# Patient Record
Sex: Male | Born: 1970 | Race: Black or African American | Hispanic: No | Marital: Married | Smoking: Never smoker
Health system: Southern US, Community
[De-identification: ages and names within clinical notes are randomized; demographics above are authoritative.]

---

## 1997-07-16 ENCOUNTER — Emergency Department (HOSPITAL_COMMUNITY): Admission: EM | Admit: 1997-07-16 | Discharge: 1997-07-16 | Payer: Self-pay | Admitting: *Deleted

## 2000-07-24 ENCOUNTER — Encounter: Payer: Self-pay | Admitting: Emergency Medicine

## 2000-07-24 ENCOUNTER — Emergency Department (HOSPITAL_COMMUNITY): Admission: EM | Admit: 2000-07-24 | Discharge: 2000-07-24 | Payer: Self-pay | Admitting: Emergency Medicine

## 2011-12-10 ENCOUNTER — Emergency Department (HOSPITAL_COMMUNITY)
Admission: EM | Admit: 2011-12-10 | Discharge: 2011-12-10 | Disposition: A | Discharge status: Home / Self Care | Payer: BC Managed Care – PPO | Attending: Emergency Medicine | Admitting: Emergency Medicine

## 2011-12-10 ENCOUNTER — Encounter (HOSPITAL_COMMUNITY): Payer: Self-pay | Admitting: Emergency Medicine

## 2011-12-10 DIAGNOSIS — R112 Nausea with vomiting, unspecified: Secondary | ICD-10-CM | POA: Insufficient documentation

## 2011-12-10 DIAGNOSIS — R51 Headache: Secondary | ICD-10-CM | POA: Insufficient documentation

## 2011-12-10 MED ORDER — DIPHENHYDRAMINE HCL 50 MG/ML IJ SOLN
25.0000 mg | Freq: Once | INTRAMUSCULAR | Status: AC
  Administered 2011-12-10: 25 mg via INTRAVENOUS
  Filled 2011-12-10: qty 1

## 2011-12-10 MED ORDER — ONDANSETRON HCL 4 MG/2ML IJ SOLN
4.0000 mg | Freq: Once | INTRAMUSCULAR | Status: AC
  Administered 2011-12-10: 4 mg via INTRAVENOUS
  Filled 2011-12-10: qty 2

## 2011-12-10 MED ORDER — HYDROMORPHONE HCL PF 1 MG/ML IJ SOLN
1.0000 mg | Freq: Once | INTRAMUSCULAR | Status: AC
  Administered 2011-12-10: 1 mg via INTRAVENOUS
  Filled 2011-12-10: qty 1

## 2011-12-10 MED ORDER — SODIUM CHLORIDE 0.9 % IV BOLUS (SEPSIS)
1000.0000 mL | Freq: Once | INTRAVENOUS | Status: AC
  Administered 2011-12-10: 1000 mL via INTRAVENOUS

## 2011-12-10 NOTE — ED Notes (Signed)
Ambulatory in department with steady gait.  No complaints of pain, no nausea.

## 2011-12-10 NOTE — ED Provider Notes (Signed)
History     CSN: [id number]  Arrival date & time 12/10/11  0047   First MD Initiated Contact with Patient 12/10/11 0126      Chief Complaint  Patient presents with  . Headache    (Consider location/radiation/quality/duration/timing/severity/associated sxs/prior treatment) HPI  Nathaniel Miller is a 41 y.o. male who presents to the Emergency Department complaining of right sided headache that began at 22100. He has taken 4 aspirin without relief. He denies vision changes, hearing changes, difficulty speaking or swallowing, stiff neck, fever, chills. He has had associated nausea and vomiting.   History reviewed. No pertinent past medical history.  History reviewed. No pertinent past surgical history.  No family history on file.  History  Substance Use Topics  . Smoking status: Never Smoker   . Smokeless tobacco: Not on file  . Alcohol Use: No      Review of Systems  Constitutional: Negative for fever.       10 Systems reviewed and are negative for acute change except as noted in the HPI.  HENT: Negative for congestion.   Eyes: Negative for discharge and redness.  Respiratory: Negative for cough and shortness of breath.   Cardiovascular: Negative for chest pain.  Gastrointestinal: Positive for nausea and vomiting. Negative for abdominal pain.  Musculoskeletal: Negative for back pain.  Skin: Negative for rash.  Neurological: Positive for headaches. Negative for syncope and numbness.  Psychiatric/Behavioral:       No behavior change.    Allergies  Review of patient's allergies indicates no known allergies.  Home Medications  No current outpatient prescriptions on file.  BP 136/92  Pulse 60  Temp 97.9 F (36.6 C) (Oral)  Resp 18  Ht 5\' 9"  (1.753 m)  Wt 205 lb (92.987 kg)  BMI 30.27 kg/m2  SpO2 98%  Physical Exam  Nursing note and vitals reviewed. Constitutional: He is oriented to person, place, and time. He appears well-developed and well-nourished.    Awake, alert, nontoxic appearance.  HENT:  Head: Normocephalic and atraumatic.  Right Ear: External ear normal.  Left Ear: External ear normal.  Nose: Nose normal.  Mouth/Throat: Oropharynx is clear and moist.  Eyes: Conjunctivae normal and EOM are normal. Pupils are equal, round, and reactive to light. Right eye exhibits no discharge. Left eye exhibits no discharge.  Neck: Neck supple.  Cardiovascular: Normal heart sounds.   Pulmonary/Chest: Effort normal and breath sounds normal. He exhibits no tenderness.  Abdominal: Soft. There is no tenderness. There is no rebound.  Musculoskeletal: He exhibits no tenderness.       Baseline ROM, no obvious new focal weakness.  Neurological: He is alert and oriented to person, place, and time. He has normal reflexes. No cranial nerve deficit.       Mental status and motor strength appears baseline for patient and situation.  Skin: No rash noted.  Psychiatric: He has a normal mood and affect.    ED Course  Procedures (including critical care time)   0400 Headache has resolved. Nausea has resolved. Patient feels ready for discharge.  MDM  Patient with headache to right side of head that began at 2100. Given IVF, analgesic, benadryl, antiemetic with relief. Pt feels improved after observation and/or treatment in ED.Pt stable in ED with no significant deterioration in condition.The patient appears reasonably screened and/or stabilized for discharge and I doubt any other medical condition or other [facility] requiring further screening, evaluation, or treatment in the ED at this time prior to discharge.  MDM  Reviewed: nursing note           Nicoletta Dress. Colon Branch, MD 12/10/11 0272

## 2011-12-10 NOTE — ED Notes (Signed)
States he had the sudden onset of severe headache with nausea and vomiting x1.  States he took a total of 4 aspirin at home without relief.  States he has had a headache similar to this one in the past.

## 2012-03-25 ENCOUNTER — Ambulatory Visit (INDEPENDENT_AMBULATORY_CARE_PROVIDER_SITE_OTHER): Payer: BC Managed Care – PPO | Admitting: Internal Medicine

## 2012-03-25 VITALS — BP 132/86 | HR 62 | Temp 97.6°F | Resp 16 | Ht 67.38 in | Wt 206.2 lb

## 2012-03-25 DIAGNOSIS — L299 Pruritus, unspecified: Secondary | ICD-10-CM

## 2012-03-25 DIAGNOSIS — B356 Tinea cruris: Secondary | ICD-10-CM

## 2012-03-25 MED ORDER — TERBINAFINE HCL 250 MG PO TABS: 250.0000 mg | ORAL_TABLET | Freq: Two times a day (BID) | ORAL | Status: DC

## 2012-03-25 MED ORDER — ECONAZOLE NITRATE 1 % EX CREA: TOPICAL_CREAM | Freq: Two times a day (BID) | CUTANEOUS | Status: DC

## 2012-03-25 NOTE — Progress Notes (Signed)
  Subjective:    Patient ID: Nathaniel Miller, male    DOB: 1970/05/02, 41 y.o.   MRN: [id number]  HPI 3 months of jock itch txed by his FP with mycolog 2 cream and otc hc cream. Told him to never use hydrocortisone cream on his genitals or his face from now on. Has an itch rash in groin covered with medication therefore cannot KOH now.    Review of Systems     Objective:   Physical Exam Rash bilateral groin, some skin break down. No signs of bacterial infection. No other issues       Assessment & Plan:  Tinea cruis/do not know if any damage from cortisone to his groin skin yet Econazole 1% cream/lamisil 250mg  bid 10 days F/up 1 week

## 2012-03-25 NOTE — Patient Instructions (Signed)
Jock Itch Jock itch is a fungal infection of the skin in the groin area. It is sometimes called "ringworm" even though it is not caused by a worm. A fungus is a type of germ that thrives in dark, damp places.  CAUSES  This infection may spread from:  A fungus infection elsewhere on the body (such as athlete's foot).  Sharing towels or clothing. This infection is more common in:  Hot, humid climates.  People who wear tight-fitting clothing or wet bathing suits for long periods of time.  Athletes.  Overweight people.  People with diabetes. SYMPTOMS  Jock itch causes the following symptoms:  Red, pink or brown rash in the groin. Rash may spread to the thighs, anus, and buttocks.  Itching. DIAGNOSIS  Your caregiver may make the diagnosis by looking at the rash. Sometimes a skin scraping will be sent to test for fungus. Testing can be done either by looking under the microscope or by doing a culture (test to try to grow the fungus). A culture can take up to 2 weeks to come back. TREATMENT  Jock itch may be treated with:  Skin cream or ointment to kill fungus.  Medicine by mouth to kill fungus.  Skin cream or ointment to calm the itching.  Compresses or medicated powders to dry the infected skin. HOME CARE INSTRUCTIONS   Be sure to treat the rash completely. Follow your caregiver's instructions. It can take a couple of weeks to treat. If you do not treat the infection long enough, the rash can come back.  Wear loose-fitting clothing.  Men should wear cotton boxer shorts.  Women should wear cotton underwear.  Avoid hot baths.  Dry the groin area well after bathing. SEEK MEDICAL CARE IF:   Your rash is worse.  Your rash is spreading.  Your rash returns after treatment is finished.  Your rash is not gone in 4 weeks. Fungal infections are slow to respond to treatment. Some redness may remain for several weeks after the fungus is gone. SEEK IMMEDIATE MEDICAL CARE  IF:  The area becomes red, warm, tender, and swollen.  You have a fever. Document Released: 01/24/2002 Document Revised: 04/28/2011 Document Reviewed: 12/24/2007 ExitCare Patient Information 2013 [employer].  

## 2012-04-13 ENCOUNTER — Ambulatory Visit (INDEPENDENT_AMBULATORY_CARE_PROVIDER_SITE_OTHER): Payer: BC Managed Care – PPO | Admitting: Family Medicine

## 2012-04-13 VITALS — BP 124/82 | HR 75 | Temp 98.9°F | Resp 17 | Ht 67.0 in | Wt 207.0 lb

## 2012-04-13 DIAGNOSIS — B356 Tinea cruris: Secondary | ICD-10-CM

## 2012-04-13 MED ORDER — HYDROXYZINE HCL 25 MG PO TABS: 25.0000 mg | ORAL_TABLET | Freq: Three times a day (TID) | ORAL | Status: DC | PRN

## 2012-04-13 NOTE — Patient Instructions (Addendum)
Thank you for coming in today. Your rash is healing well.  Itching is normal.  Take hydroxyzine at night as needed.  Use the cream until is runs out.  For severe itching use gold bond itch.   Come back as needed.

## 2012-04-13 NOTE — Progress Notes (Signed)
Nathaniel Miller is a 42 y.o. male who presents to [facility] today for followup tinea cruris.  Was seen in March 25, 2012 for long-standing tinea capitis. He was treated with topical antifungals and oral Lamisil.  He notes this has worked very well. His rash is significantly improved. However he notes recent onset of itching. Less than a shower he scratched significantly.  He peeled off some old skin and wants to know if everything is okay.  No fevers chills weakness numbness. Well otherwise.   PMH: Reviewed healthy History  Substance Use Topics  . Smoking status: Never Smoker   . Smokeless tobacco: Not on file  . Alcohol Use: No   ROS as above  Medications reviewed. Current Outpatient Prescriptions  Medication Sig Dispense Refill  . econazole nitrate 1 % cream Apply topically 2 (two) times daily.  30 g  3  . terbinafine (LAMISIL) 250 MG tablet Take 1 tablet (250 mg total) by mouth 2 (two) times daily.  20 tablet  0  . hydrOXYzine (ATARAX/VISTARIL) 25 MG tablet Take 1 tablet (25 mg total) by mouth 3 (three) times daily as needed for itching.  30 tablet  0   No current facility-administered medications for this visit.    Exam:  BP 124/82  Pulse 75  Temp(Src) 98.9 F (37.2 C) (Oral)  Resp 17  Ht 5\' 7"  (1.702 m)  Wt 207 lb (93.895 kg)  BMI 32.41 kg/m2  SpO2 97% Gen: Well NAD Skin: Well-appearing new skin and intertriginous areas bilateral groin. No scale or significant erythema.  No results found for this or any previous visit (from the past 72 hour(s)).  Assessment and Plan: 42 y.o. male with resolving tinea curis.  Plan: Continue topical antifungals Oral hydroxyzine as needed at night for itching Gold Bond Itch as needed. F/u prn

## 2012-08-27 ENCOUNTER — Other Ambulatory Visit: Payer: Self-pay

## 2012-08-27 NOTE — Telephone Encounter (Signed)
Last seen 04/21/12  DWM

## 2012-09-17 ENCOUNTER — Other Ambulatory Visit: Payer: Self-pay

## 2012-12-02 ENCOUNTER — Encounter: Payer: Self-pay | Admitting: Family Medicine

## 2012-12-02 ENCOUNTER — Ambulatory Visit (INDEPENDENT_AMBULATORY_CARE_PROVIDER_SITE_OTHER): Payer: BC Managed Care – PPO | Admitting: Family Medicine

## 2012-12-02 ENCOUNTER — Encounter (INDEPENDENT_AMBULATORY_CARE_PROVIDER_SITE_OTHER): Payer: Self-pay

## 2012-12-02 VITALS — BP 148/89 | HR 59 | Temp 97.0°F | Wt 201.8 lb

## 2012-12-02 DIAGNOSIS — Z8669 Personal history of other diseases of the nervous system and sense organs: Secondary | ICD-10-CM

## 2012-12-02 DIAGNOSIS — L299 Pruritus, unspecified: Secondary | ICD-10-CM

## 2012-12-02 DIAGNOSIS — B356 Tinea cruris: Secondary | ICD-10-CM

## 2012-12-02 MED ORDER — TERBINAFINE HCL 250 MG PO TABS: 250.0000 mg | ORAL_TABLET | Freq: Two times a day (BID) | ORAL | Status: DC

## 2012-12-02 MED ORDER — TOPIRAMATE 50 MG PO TABS: 50.0000 mg | ORAL_TABLET | Freq: Two times a day (BID) | ORAL | Status: DC

## 2012-12-02 MED ORDER — KETOCONAZOLE 2 % EX CREA: TOPICAL_CREAM | Freq: Every day | CUTANEOUS | Status: DC

## 2012-12-02 NOTE — Progress Notes (Signed)
  Subjective:    Patient ID: Nathaniel Miller, male    DOB: [birth year], 42 y.o.   MRN: [id number]  HPI This 42 y.o. male presents for evaluation of c/o jock itch.  He has had this in the past and now It is back.  He is having frequent migraines happening every 3 weeks.  He used to get them Annually they have increased.  He does get aura.   Review of Systems C/o headache and aura.  C/o rash. No chest pain, SOB, dizziness, vision change, N/V, diarrhea, constipation, dysuria, urinary urgency or frequency.     Objective:   Physical Exam Vital signs noted  Well developed well nourished male.  HEENT - Head atraumatic Normocephalic                Eyes - PERRLA, Conjuctiva - clear Sclera- Clear EOMI                Ears - EAC's Wnl TM's Wnl Gross Hearing WNL                Nose - Nares patent                 Throat - oropharanx wnl Respiratory - Lungs CTA bilateral Cardiac - RRR S1 and S2 without murmur GI - Abdomen soft Nontender and bowel sounds active x 4. Skin - Rash in groin bilateral.       Assessment & Plan:  Tinea cruris - Plan: ketoconazole (NIZORAL) 2 % cream, terbinafine (LAMISIL) 250 MG tablet  Jock Itch - Plan: terbinafine (LAMISIL) 250 MG tablet  History of migraine headaches - Plan: topiramate (TOPAMAX) 50 MG tablet  Deatra Canter FNP

## 2012-12-02 NOTE — Patient Instructions (Signed)
Headache and Allergies The relationship between allergies and headaches is unclear. Many people with allergic or infectious nasal problems also have headaches (migraines or sinus headaches). However, sometimes allergies can cause pressure that feels like a headache, and sometimes headaches can cause allergy-like symptoms. It is not always clear whether your symptoms are caused by allergies or by a headache. CAUSES   Migraine: The cause of a migraine is not always known.  Sinus Headache: The cause of a sinus headache may be a sinus infection. Other conditions that may be related to sinus headaches include:  Hay fever (allergic rhinitis).  Deviation of the nasal septum.  Swelling or clogging of the nasal passages. SYMPTOMS  Migraine headache symptoms (which often last 4 to 72 hours) include:  Intense, throbbing pain on one or both sides of the head.  Nausea.  Vomiting.  Being extra sensitive to light.  Being extra sensitive to sound.  Nervous system reactions that appear similar to an allergic reaction:  Stuffy nose.  Runny nose.  Tearing. Sinus headaches are felt as facial pain or pressure.  DIAGNOSIS  Because there is some overlap in symptoms, sinus and migraine headaches are often misdiagnosed. For example, a person with migraines may also feel facial pressure. Likewise, many people with hay fever may get migraine headaches rather than sinus headaches. These migraines can be triggered by the histamine release during an allergic reaction. An antihistamine medicine can eliminate this pain. There are standard criteria that help clarify the difference between these headaches and related allergy or allergy-like symptoms. Your caregiver can use these criteria to determine the proper diagnosis and provide you the best care. TREATMENT  Migraine medicine may help people who have persistent migraine headaches even though their hay fever is controlled. For some people, anti-inflammatory  treatments do not work to relieve migraines. Medicines called triptans (such as sumatriptan) can be helpful for those people. Document Released: 04/26/2003 Document Revised: 04/28/2011 Document Reviewed: 05/18/2009 ExitCare Patient Information 2014 [employer].  

## 2012-12-03 ENCOUNTER — Telehealth: Payer: Self-pay | Admitting: Family Medicine

## 2012-12-06 ENCOUNTER — Other Ambulatory Visit: Payer: Self-pay | Admitting: Family Medicine

## 2012-12-06 MED ORDER — NYSTATIN-TRIAMCINOLONE 100000-0.1 UNIT/GM-% EX OINT: TOPICAL_OINTMENT | Freq: Two times a day (BID) | CUTANEOUS | Status: DC

## 2012-12-23 ENCOUNTER — Other Ambulatory Visit: Payer: Self-pay

## 2013-01-11 ENCOUNTER — Other Ambulatory Visit: Payer: Self-pay | Admitting: Family Medicine

## 2013-01-11 DIAGNOSIS — L299 Pruritus, unspecified: Secondary | ICD-10-CM

## 2013-01-11 DIAGNOSIS — B356 Tinea cruris: Secondary | ICD-10-CM

## 2013-01-11 MED ORDER — TERBINAFINE HCL 250 MG PO TABS: ORAL_TABLET | ORAL | Status: DC

## 2013-02-03 ENCOUNTER — Other Ambulatory Visit: Payer: Self-pay | Admitting: Nurse Practitioner

## 2013-02-03 DIAGNOSIS — B356 Tinea cruris: Secondary | ICD-10-CM

## 2013-02-03 DIAGNOSIS — L299 Pruritus, unspecified: Secondary | ICD-10-CM

## 2013-02-03 MED ORDER — TERBINAFINE HCL 250 MG PO TABS: ORAL_TABLET | ORAL | Status: DC

## 2013-08-09 ENCOUNTER — Encounter: Payer: Self-pay | Admitting: Physician Assistant

## 2013-08-09 ENCOUNTER — Ambulatory Visit: Payer: BC Managed Care – PPO | Admitting: Physician Assistant

## 2013-08-09 ENCOUNTER — Ambulatory Visit (INDEPENDENT_AMBULATORY_CARE_PROVIDER_SITE_OTHER): Payer: BC Managed Care – PPO | Admitting: Physician Assistant

## 2013-08-09 VITALS — BP 142/90 | HR 66 | Temp 98.8°F | Ht 67.0 in | Wt 202.0 lb

## 2013-08-09 DIAGNOSIS — I10 Essential (primary) hypertension: Secondary | ICD-10-CM

## 2013-08-09 DIAGNOSIS — T50995A Adverse effect of other drugs, medicaments and biological substances, initial encounter: Secondary | ICD-10-CM

## 2013-08-09 DIAGNOSIS — T50905A Adverse effect of unspecified drugs, medicaments and biological substances, initial encounter: Secondary | ICD-10-CM

## 2013-08-09 NOTE — Patient Instructions (Signed)
Hypertension Hypertension, commonly called high blood pressure, is when the force of blood pumping through your arteries is too strong. Your arteries are the blood vessels that carry blood from your heart throughout your body. A blood pressure reading consists of a higher number over a lower number, such as 110/72. The higher number (systolic) is the pressure inside your arteries when your heart pumps. The lower number (diastolic) is the pressure inside your arteries when your heart relaxes. Ideally you want your blood pressure below 120/80. Hypertension forces your heart to work harder to pump blood. Your arteries may become narrow or stiff. Having hypertension puts you at risk for heart disease, stroke, and other problems.  RISK FACTORS Some risk factors for high blood pressure are controllable. Others are not.  Risk factors you cannot control include:   Race. You may be at higher risk if you are African American.  Age. Risk increases with age.  Gender. Men are at higher risk than women before age 45 years. After age 65, women are at higher risk than men. Risk factors you can control include:  Not getting enough exercise or physical activity.  Being overweight.  Getting too much fat, sugar, calories, or salt in your diet.  Drinking too much alcohol. SIGNS AND SYMPTOMS Hypertension does not usually cause signs or symptoms. Extremely high blood pressure (hypertensive crisis) may cause headache, anxiety, shortness of breath, and nosebleed. DIAGNOSIS  To check if you have hypertension, your health care provider will measure your blood pressure while you are seated, with your arm held at the level of your heart. It should be measured at least twice using the same arm. Certain conditions can cause a difference in blood pressure between your right and left arms. A blood pressure reading that is higher than normal on one occasion does not mean that you need treatment. If one blood pressure reading  is high, ask your health care provider about having it checked again. TREATMENT  Treating high blood pressure includes making lifestyle changes and possibly taking medication. Living a healthy lifestyle can help lower high blood pressure. You may need to change some of your habits. Lifestyle changes may include:  Following the DASH diet. This diet is high in fruits, vegetables, and whole grains. It is low in salt, red meat, and added sugars.  Getting at least 2 1/2 hours of brisk physical activity every week.  Losing weight if necessary.  Not smoking.  Limiting alcoholic beverages.  Learning ways to reduce stress. If lifestyle changes are not enough to get your blood pressure under control, your health care provider may prescribe medicine. You may need to take more than one. Work closely with your health care provider to understand the risks and benefits. HOME CARE INSTRUCTIONS  Have your blood pressure rechecked as directed by your health care provider.   Only take medicine as directed by your health care provider. Follow the directions carefully. Blood pressure medicines must be taken as prescribed. The medicine does not work as well when you skip doses. Skipping doses also puts you at risk for problems.   Do not smoke.   Monitor your blood pressure at home as directed by your health care provider. SEEK MEDICAL CARE IF:   You think you are having a reaction to medicines taken.  You have recurrent headaches or feel dizzy.  You have swelling in your ankles.  You have trouble with your vision. SEEK IMMEDIATE MEDICAL CARE IF:  You develop a severe headache or   confusion.  You have unusual weakness, numbness, or feel faint.  You have severe chest or abdominal pain.  You vomit repeatedly.  You have trouble breathing. MAKE SURE YOU:   Understand these instructions.  Will watch your condition.  Will get help right away if you are not doing well or get  worse. Document Released: 02/03/2005 Document Revised: 02/08/2013 Document Reviewed: 11/26/2012 ExitCare Patient Information 2015 [employer]. This information is not intended to replace advice given to you by your health care provider. Make sure you discuss any questions you have with your health care provider.  

## 2013-08-09 NOTE — Progress Notes (Signed)
Subjective:     Patient ID: Nathaniel Miller, male   DOB: [birth year], 43 y.o.   MRN: [id number]  HPI Pt here due to concerns of a supplement he ha been taking He took a supplement called VO2Max Pt was taking more than the suggested dose and states he just did not feel right Since stopping things have returned to normal He is very active lifting weights No sx at time of appt  Review of Systems  Constitutional: Negative.   Respiratory: Negative for apnea, cough, choking, chest tightness and shortness of breath.   Cardiovascular: Negative for chest pain, palpitations and leg swelling.       Objective:   Physical Exam  Nursing note and vitals reviewed. Constitutional: He appears well-developed and well-nourished.  Neck: No JVD present. No tracheal deviation present. No thyromegaly present.  Cardiovascular: Normal rate, regular rhythm, normal heart sounds and intact distal pulses.   Pulmonary/Chest: Breath sounds normal. No respiratory distress. He has no wheezes.  Musculoskeletal: He exhibits no edema.  Lymphadenopathy:    He has no cervical adenopathy.       Assessment:     Medicine reaction    Plan:     Reviewed the contents of his supplements with him and the possible SE Reviewed his borderline BP with him and the danger of stimulant type supplements Would like him to watch his BP through the plant nurse If readings remain at current level return for possible meds May try to supplement in small doses to see if he can tolerate F/U pending his BP readings

## 2013-08-29 ENCOUNTER — Telehealth: Payer: Self-pay | Admitting: *Deleted

## 2013-08-29 NOTE — Telephone Encounter (Signed)
Received fax from pharmacy requesting viagra 100mg . Take one tablet by mouth as needed. Originally given on 06-05-11 by Greig CastillaAndrew. Last seen in office on 6-23 by Montey HoraBill Webster. Please advise on refills

## 2013-08-30 ENCOUNTER — Other Ambulatory Visit: Payer: Self-pay | Admitting: Family Medicine

## 2013-08-30 MED ORDER — SILDENAFIL CITRATE 100 MG PO TABS: 50.0000 mg | ORAL_TABLET | Freq: Every day | ORAL | Status: DC | PRN

## 2013-08-30 NOTE — Telephone Encounter (Signed)
rx of viagra sent to pharm

## 2014-04-25 ENCOUNTER — Other Ambulatory Visit: Payer: Self-pay | Admitting: Family Medicine

## 2014-04-25 DIAGNOSIS — B356 Tinea cruris: Secondary | ICD-10-CM

## 2014-04-25 DIAGNOSIS — L299 Pruritus, unspecified: Secondary | ICD-10-CM

## 2014-04-25 MED ORDER — TERBINAFINE HCL 250 MG PO TABS: ORAL_TABLET | ORAL | Status: DC

## 2014-04-25 NOTE — Telephone Encounter (Signed)
Patient aware rx sent to pharmacy.  

## 2014-04-25 NOTE — Telephone Encounter (Signed)
rx sent to pharmacy

## 2015-02-21 ENCOUNTER — Ambulatory Visit (INDEPENDENT_AMBULATORY_CARE_PROVIDER_SITE_OTHER): Payer: BLUE CROSS/BLUE SHIELD | Admitting: Family Medicine

## 2015-02-21 ENCOUNTER — Encounter: Payer: Self-pay | Admitting: Family Medicine

## 2015-02-21 VITALS — BP 128/76 | HR 67 | Temp 97.9°F | Ht 67.0 in | Wt 205.0 lb

## 2015-02-21 DIAGNOSIS — B356 Tinea cruris: Secondary | ICD-10-CM | POA: Diagnosis not present

## 2015-02-21 MED ORDER — CLOTRIMAZOLE-BETAMETHASONE 1-0.05 % EX CREA: 1.0000 "application " | TOPICAL_CREAM | Freq: Two times a day (BID) | CUTANEOUS | Status: DC

## 2015-02-21 NOTE — Assessment & Plan Note (Signed)
Recurrent and has been using hydrocortisone, we'll send him something with antifungal and steroid in it.

## 2015-02-21 NOTE — Progress Notes (Signed)
BP 128/76 mmHg  Pulse 67  Temp(Src) 97.9 F (36.6 C) (Oral)  Ht 5\' 7"  (1.702 m)  Wt 205 lb (92.987 kg)  BMI 32.10 kg/m2   Subjective:    Patient ID: Nathaniel Miller, male    DOB: Mar 25, 1970, 45 y.o.   MRN: [id number]  HPI: Nathaniel Miller is a 45 y.o. male presenting on 02/21/2015 for Itchy rash in groin area   HPI Groin rash Patient has recurrent jock itch. He has been using hydrocortisone cream for it at this time appears to be worsening. Has a lot of pruritus in the region and some pink rash that is worse in the intertrigo areas and on his gonadal sac. He denies any fevers or chills or erythema or warmth or drainage.  Relevant past medical, surgical, family and social history reviewed and updated as indicated. Interim medical history since our last visit reviewed. Allergies and medications reviewed and updated.  Review of Systems  Constitutional: Negative for fever and chills.  HENT: Negative for ear discharge and ear pain.   Eyes: Negative for discharge and visual disturbance.  Respiratory: Negative for shortness of breath and wheezing.   Cardiovascular: Negative for chest pain and leg swelling.  Gastrointestinal: Negative for abdominal pain, diarrhea and constipation.  Genitourinary: Negative for difficulty urinating.  Musculoskeletal: Negative for back pain and gait problem.  Skin: Positive for rash.  Neurological: Negative for syncope, light-headedness and headaches.  All other systems reviewed and are negative.   Per HPI unless specifically indicated above     Medication List       This list is accurate as of: 02/21/15 12:56 PM.  Always use your most recent med list.               clotrimazole-betamethasone cream  Commonly known as:  LOTRISONE  Apply 1 application topically 2 (two) times daily.     hydrocortisone cream 1 %  Apply 1 application topically 4 (four) times daily.           Objective:    BP 128/76 mmHg  Pulse 67  Temp(Src) 97.9 F  (36.6 C) (Oral)  Ht 5\' 7"  (1.702 m)  Wt 205 lb (92.987 kg)  BMI 32.10 kg/m2  Wt Readings from Last 3 Encounters:  02/21/15 205 lb (92.987 kg)  08/09/13 202 lb (91.627 kg)  12/02/12 201 lb 12.8 oz (91.536 kg)    Physical Exam  Constitutional: He is oriented to person, place, and time. He appears well-developed and well-nourished. No distress.  Eyes: Conjunctivae and EOM are normal. Pupils are equal, round, and reactive to light. Right eye exhibits no discharge. No scleral icterus.  Neck: Neck supple. No thyromegaly present.  Cardiovascular: Normal rate, regular rhythm, normal heart sounds and intact distal pulses.   No murmur heard. Pulmonary/Chest: Effort normal and breath sounds normal. No respiratory distress. He has no wheezes.  Musculoskeletal: Normal range of motion. He exhibits no edema.  Neurological: He is alert and oriented to person, place, and time. Coordination normal.  Skin: Skin is warm and dry. Rash (In the groin in intertrigonal area and some on gonadal sac. Pink papules with satellite lesions) noted. Rash is papular. He is not diaphoretic.  Psychiatric: He has a normal mood and affect. His behavior is normal.  Nursing note and vitals reviewed.   No results found for this or any previous visit.    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      Relevant Medications   clotrimazole-betamethasone (LOTRISONE) cream       Follow up plan: Return if symptoms worsen or fail to improve, for Adult well exam and screening labs.  Counseling provided for all of the vaccine components No orders of the defined types were placed in this encounter.    Arville Care, MD [facility] 02/21/2015, 12:56 PM

## 2015-02-22 ENCOUNTER — Ambulatory Visit: Payer: Self-pay | Admitting: Family Medicine

## 2015-04-22 ENCOUNTER — Other Ambulatory Visit: Payer: Self-pay | Admitting: Family Medicine

## 2015-04-23 ENCOUNTER — Other Ambulatory Visit: Payer: Self-pay | Admitting: Family

## 2015-04-23 DIAGNOSIS — B356 Tinea cruris: Secondary | ICD-10-CM

## 2015-04-23 MED ORDER — CLOTRIMAZOLE-BETAMETHASONE 1-0.05 % EX CREA: 1.0000 "application " | TOPICAL_CREAM | Freq: Two times a day (BID) | CUTANEOUS | Status: DC

## 2015-09-12 ENCOUNTER — Other Ambulatory Visit: Payer: Self-pay | Admitting: Family

## 2015-09-12 DIAGNOSIS — B356 Tinea cruris: Secondary | ICD-10-CM

## 2015-09-13 ENCOUNTER — Other Ambulatory Visit: Payer: Self-pay | Admitting: Family Medicine

## 2015-09-13 DIAGNOSIS — B356 Tinea cruris: Secondary | ICD-10-CM

## 2015-09-13 MED ORDER — CLOTRIMAZOLE-BETAMETHASONE 1-0.05 % EX CREA: 1.0000 "application " | TOPICAL_CREAM | Freq: Two times a day (BID) | CUTANEOUS | 0 refills | Status: DC

## 2015-09-14 MED ORDER — CLOTRIMAZOLE-BETAMETHASONE 1-0.05 % EX CREA: 1.0000 "application " | TOPICAL_CREAM | Freq: Two times a day (BID) | CUTANEOUS | 0 refills | Status: DC

## 2015-12-20 ENCOUNTER — Other Ambulatory Visit: Payer: Self-pay | Admitting: Pediatrics

## 2015-12-20 ENCOUNTER — Other Ambulatory Visit: Payer: Self-pay

## 2015-12-20 DIAGNOSIS — B356 Tinea cruris: Secondary | ICD-10-CM

## 2015-12-20 MED ORDER — CLOTRIMAZOLE-BETAMETHASONE 1-0.05 % EX CREA: 1.0000 "application " | TOPICAL_CREAM | Freq: Two times a day (BID) | CUTANEOUS | 1 refills | Status: DC

## 2016-05-31 ENCOUNTER — Telehealth: Payer: BLUE CROSS/BLUE SHIELD | Admitting: Nurse Practitioner

## 2016-05-31 DIAGNOSIS — L739 Follicular disorder, unspecified: Secondary | ICD-10-CM

## 2016-05-31 MED ORDER — CEPHALEXIN 500 MG PO CAPS: 500.0000 mg | ORAL_CAPSULE | Freq: Four times a day (QID) | ORAL | 0 refills | Status: DC

## 2016-05-31 NOTE — Progress Notes (Signed)
E Visit for Rash  We are sorry that you are not feeling well. Here is how we plan to help!      Keflex 500 mg three times per day for 7 days       HOME CARE:   Take cool showers and avoid direct sunlight.  Apply cool compress or wet dressings.  Take a bath in an oatmeal bath.  Sprinkle content of one Aveeno packet under running faucet with comfortably warm water.  Bathe for 15-20 minutes, 1-2 times daily.  Pat dry with a towel. Do not rub the rash.  Use hydrocortisone cream.  Take an antihistamine like Benadryl for widespread rashes that itch.  The adult dose of Benadryl is 25-50 mg by mouth 4 times daily.  Caution:  This type of medication may cause sleepiness.  Do not drink alcohol, drive, or operate dangerous machinery while taking antihistamines.  Do not take these medications if you have prostate enlargement.  Read package instructions thoroughly on all medications that you take.  GET HELP RIGHT AWAY IF:   Symptoms don't go away after treatment.  Severe itching that persists.  If you rash spreads or swells.  If you rash begins to smell.  If it blisters and opens or develops a yellow-brown crust.  You develop a fever.  You have a sore throat.  You become short of breath.  MAKE SURE YOU:  Understand these instructions. Will watch your condition. Will get help right away if you are not doing well or get worse.  Thank you for choosing an e-visit. Your e-visit answers were reviewed by a board certified advanced clinical practitioner to complete your personal care plan. Depending upon the condition, your plan could have included both over the counter or prescription medications. Please review your pharmacy choice. Be sure that the pharmacy you have chosen is open so that you can pick up your prescription now.  If there is a problem you may message your provider in MyChart to have the prescription routed to another pharmacy. Your safety is important to us. If  you have drug allergies check your prescription carefully.  For the next 24 hours, you can use MyChart to ask questions about today's visit, request a non-urgent call back, or ask for a work or school excuse from your e-visit provider. You will get an email in the next two days asking about your experience. I hope that your e-visit has been valuable and will speed your recovery.      

## 2016-06-03 ENCOUNTER — Other Ambulatory Visit: Payer: Self-pay | Admitting: Nurse Practitioner

## 2016-06-03 DIAGNOSIS — B356 Tinea cruris: Secondary | ICD-10-CM

## 2016-06-03 MED ORDER — CLOTRIMAZOLE-BETAMETHASONE 1-0.05 % EX CREA: 1.0000 "application " | TOPICAL_CREAM | Freq: Two times a day (BID) | CUTANEOUS | 1 refills | Status: DC

## 2016-12-25 ENCOUNTER — Encounter: Payer: Self-pay | Admitting: Family Medicine

## 2016-12-25 ENCOUNTER — Other Ambulatory Visit: Payer: Self-pay | Admitting: Nurse Practitioner

## 2016-12-25 DIAGNOSIS — B356 Tinea cruris: Secondary | ICD-10-CM

## 2016-12-25 MED ORDER — CLOTRIMAZOLE-BETAMETHASONE 1-0.05 % EX CREA: 1.0000 "application " | TOPICAL_CREAM | Freq: Two times a day (BID) | CUTANEOUS | 1 refills | Status: DC

## 2016-12-25 NOTE — Telephone Encounter (Signed)
I do not know what he needs it for but he will likely have to be seen again

## 2017-02-02 ENCOUNTER — Encounter: Payer: Self-pay | Admitting: Family Medicine

## 2017-02-02 ENCOUNTER — Ambulatory Visit (INDEPENDENT_AMBULATORY_CARE_PROVIDER_SITE_OTHER): Payer: BLUE CROSS/BLUE SHIELD | Admitting: Family Medicine

## 2017-02-02 VITALS — BP 131/89 | HR 65 | Temp 96.8°F | Ht 67.0 in | Wt 207.0 lb

## 2017-02-02 DIAGNOSIS — G43009 Migraine without aura, not intractable, without status migrainosus: Secondary | ICD-10-CM | POA: Insufficient documentation

## 2017-02-02 MED ORDER — TOPIRAMATE 50 MG PO TABS: 50.0000 mg | ORAL_TABLET | Freq: Two times a day (BID) | ORAL | 1 refills | Status: DC

## 2017-02-02 NOTE — Progress Notes (Signed)
BP 131/89   Pulse 65   Temp (!) 96.8 F (36 C) (Oral)   Ht 5\' 7"  (1.702 m)   Wt 207 lb (93.9 kg)   BMI 32.42 kg/m    Subjective:    Patient ID: Nathaniel Miller, male    DOB: [birth year], 46 y.o.   MRN: [id number]  HPI: Nathaniel HugeLamont C Meuth is a 46 y.o. male presenting on 02/02/2017 for Migraine headaches (had history of migraines about 3 years ago but they had improved until about a month ago; taking Ibuprofen)   HPI Headaches and migraines Patient comes in complaining of migraine headaches that have been bothering him over the past few months.  He says he had issues like this over 3 years ago and he went on a medication daily for a few months and then they did not come back until recently.  He can pinpoint the only thing that changed over the recent 3 months is that he is getting some stress from his wife to buy a new car and financially that is putting a little bit of stress on them.  He describes the headaches as left-sided starting in the frontal region and going back and pounding.  He denies any fevers or chills or sinus pressure or drainage.  He denies any shortness of breath or wheezing.  He does have light sensitivity and nausea and vomiting with it.  What he usually takes between 7 and 8 ibuprofen to finally get rid of it.  The headaches last anywhere between 4-8 hours and have been occurring about once a week.  Relevant past medical, surgical, family and social history reviewed and updated as indicated. Interim medical history since our last visit reviewed. Allergies and medications reviewed and updated.  Review of Systems  Constitutional: Negative for chills and fever.  Respiratory: Negative for shortness of breath and wheezing.   Cardiovascular: Negative for chest pain and leg swelling.  Gastrointestinal: Positive for nausea and vomiting.  Musculoskeletal: Negative for back pain and gait problem.  Skin: Negative for rash.  Neurological: Positive for headaches. Negative for  dizziness, speech difficulty, weakness and numbness.  All other systems reviewed and are negative.   Per HPI unless specifically indicated above     Objective:    BP 131/89   Pulse 65   Temp (!) 96.8 F (36 C) (Oral)   Ht 5\' 7"  (1.702 m)   Wt 207 lb (93.9 kg)   BMI 32.42 kg/m   Wt Readings from Last 3 Encounters:  02/02/17 207 lb (93.9 kg)  02/21/15 205 lb (93 kg)  08/09/13 202 lb (91.6 kg)    Physical Exam  Constitutional: He is oriented to person, place, and time. He appears well-developed and well-nourished. No distress.  HENT:  Right Ear: Tympanic membrane, external ear and ear canal normal.  Left Ear: Tympanic membrane, external ear and ear canal normal.  Nose: No mucosal edema, rhinorrhea or sinus tenderness. No epistaxis. Right sinus exhibits no maxillary sinus tenderness and no frontal sinus tenderness. Left sinus exhibits no maxillary sinus tenderness and no frontal sinus tenderness.  Mouth/Throat: Uvula is midline and mucous membranes are normal. No oropharyngeal exudate, posterior oropharyngeal edema, posterior oropharyngeal erythema or tonsillar abscesses.  Eyes: Conjunctivae are normal. No scleral icterus.  Neck: Neck supple. No thyromegaly present.  Cardiovascular: Normal rate, regular rhythm, normal heart sounds and intact distal pulses.  No murmur heard. Pulmonary/Chest: Effort normal and breath sounds normal. No respiratory distress. He has no wheezes. He has  no rales.  Musculoskeletal: Normal range of motion. He exhibits no edema.  Lymphadenopathy:    He has no cervical adenopathy.  Neurological: He is alert and oriented to person, place, and time. No cranial nerve deficit. He exhibits normal muscle tone. Coordination normal.  Skin: Skin is warm and dry. No rash noted. He is not diaphoretic.  Psychiatric: He has a normal mood and affect. His behavior is normal.  Nursing note and vitals reviewed.   No results found for this or any previous visit.      Assessment & Plan:   Problem List Items Addressed This Visit      Cardiovascular and Mediastinum   Migraine without aura and without status migrainosus, not intractable - Primary   Relevant Medications   topiramate (TOPAMAX) 50 MG tablet       Follow up plan: Return if symptoms worsen or fail to improve.  Counseling provided for all of the vaccine components No orders of the defined types were placed in this encounter.   Arville CareJoshua Dettinger, MD [facility] 02/02/2017, 8:41 AM

## 2017-06-14 ENCOUNTER — Encounter: Payer: Self-pay | Admitting: Family Medicine

## 2017-06-15 ENCOUNTER — Other Ambulatory Visit: Payer: Self-pay | Admitting: Family

## 2017-06-15 DIAGNOSIS — B356 Tinea cruris: Secondary | ICD-10-CM

## 2017-06-15 MED ORDER — CLOTRIMAZOLE-BETAMETHASONE 1-0.05 % EX CREA: 1.0000 "application " | TOPICAL_CREAM | Freq: Two times a day (BID) | CUTANEOUS | 1 refills | Status: DC | PRN

## 2017-12-30 ENCOUNTER — Encounter: Payer: Self-pay | Admitting: Family Medicine

## 2017-12-30 DIAGNOSIS — B356 Tinea cruris: Secondary | ICD-10-CM

## 2017-12-31 MED ORDER — CLOTRIMAZOLE-BETAMETHASONE 1-0.05 % EX CREA: 1.0000 "application " | TOPICAL_CREAM | Freq: Two times a day (BID) | CUTANEOUS | 1 refills | Status: DC | PRN

## 2018-09-09 ENCOUNTER — Encounter: Payer: Self-pay | Admitting: Family Medicine

## 2018-09-09 DIAGNOSIS — B356 Tinea cruris: Secondary | ICD-10-CM

## 2018-09-09 MED ORDER — CLOTRIMAZOLE-BETAMETHASONE 1-0.05 % EX CREA: 1.0000 "application " | TOPICAL_CREAM | Freq: Two times a day (BID) | CUTANEOUS | 1 refills | Status: DC | PRN

## 2018-12-27 ENCOUNTER — Other Ambulatory Visit: Payer: Self-pay

## 2018-12-30 ENCOUNTER — Encounter (HOSPITAL_COMMUNITY): Payer: Self-pay | Admitting: Emergency Medicine

## 2018-12-30 ENCOUNTER — Ambulatory Visit (HOSPITAL_COMMUNITY)
Admission: EM | Admit: 2018-12-30 | Discharge: 2018-12-30 | Disposition: A | Discharge status: Home / Self Care | Payer: BC Managed Care – PPO | Attending: Family Medicine | Admitting: Family Medicine

## 2018-12-30 ENCOUNTER — Other Ambulatory Visit: Payer: Self-pay

## 2018-12-30 DIAGNOSIS — M79604 Pain in right leg: Secondary | ICD-10-CM

## 2018-12-30 MED ORDER — DEXAMETHASONE SODIUM PHOSPHATE 10 MG/ML IJ SOLN
10.0000 mg | Freq: Once | INTRAMUSCULAR | Status: AC
  Administered 2018-12-30: 10 mg via INTRAMUSCULAR

## 2018-12-30 MED ORDER — DEXAMETHASONE SODIUM PHOSPHATE 10 MG/ML IJ SOLN
INTRAMUSCULAR | Status: AC
  Filled 2018-12-30: qty 1

## 2018-12-30 MED ORDER — DICLOFENAC SODIUM 75 MG PO TBEC: 75.0000 mg | DELAYED_RELEASE_TABLET | Freq: Two times a day (BID) | ORAL | 0 refills | Status: DC
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
## 2018-12-30 NOTE — ED Provider Notes (Signed)
[place]   [id number] 12/30/18 Arrival Time: 6270  ASSESSMENT & PLAN:  1. Right leg pain   With sciatic-like symptoms.   Able to ambulate here and hemodynamically stable. No indication for imaging of back at this time given no trauma and normal neurological exam. Discussed.  Meds ordered this encounter  Medications  . dexamethasone (DECADRON) injection 10 mg  . diclofenac (VOLTAREN) 75 MG EC tablet    Sig: Take 1 tablet (75 mg total) by mouth 2 (two) times daily.    Dispense:  14 tablet    Refill:  0   Encourage ROM/movement as tolerated.  Recommend: Follow-up Information    [place].   Why: If worsening or failing to improve as anticipated. Contact information: [address] [place] [phone]          Reviewed expectations re: course of current medical issues. Questions answered. Outlined signs and symptoms indicating need for more acute intervention. Patient verbalized understanding. After Visit Summary given.   SUBJECTIVE: History from: patient.  Nathaniel Miller is a 48 y.o. male who presents with complaint of intermittent pain of his R "hip area"; mild lower back discomfort but mostly hip/leg. Onset gradual. First noted 2-3 d ago. Injury/trama: no. History of back problems requiring medical care: none reported. Pain described as aching with occasional sharp pain that travels from hip to knee. Pain is better with walking/standing, somewhat worse with movements involving back, and unchanged with rest. Definitely worse after prolonged sitting. Progressive LE weakness or saddle anesthesia: none. Extremity sensation changes or weakness: none. Ambulatory with difficulty. Normal bowel/bladder habits: yes; without urinary retention. Normal PO intake without n/v. No associated abdominal pain/n/v. Self treatment: has tried prn OTC analgesics without much relief.  Reports no chronic steroid  use, fevers, IV drug use, or recent back surgeries or procedures.  ROS: As per HPI. All other systems negative.   OBJECTIVE:  Vitals:   12/30/18 1511 12/30/18 1513  BP: (!) 149/112 134/88  Pulse: 60   Resp: 16   Temp: 99 F (37.2 C)   TempSrc: Oral   SpO2: 100%     General appearance: alert; no distress HEENT: [place]; AT Neck: supple with FROM; without midline tenderness CV: RRR Lungs: unlabored respirations; symmetrical air entry Abdomen: soft, non-tender; non-distended Back: no specific tenderness to palpation; FROM at waist; bruising: none; without midline tenderness Extremities: without edema; symmetrical without gross deformities; normal ROM of bilateral UE; does report vague discomfort over R buttock and lateral thigh Skin: warm and dry Neurologic: normal gait; normal reflexes of bilateral LE; normal sensation of bilateral LE; normal strength of bilateral LE Psychological: alert and cooperative; normal mood and affect    No Known Allergies  PMH: Migraines.  Social History   Socioeconomic History  . Marital status: Married    Spouse name: Not on file  . Number of children: Not on file  . Years of education: Not on file  . Highest education level: Not on file  Occupational History  . Not on file  Social Needs  . Financial resource strain: Not on file  . Food insecurity    Worry: Not on file    Inability: Not on file  . Transportation needs    Medical: Not on file    Non-medical: Not on file  Tobacco Use  . Smoking status: Never Smoker  . Smokeless tobacco: Never Used  Substance and Sexual Activity  . Alcohol use:  No  . Drug use: No  . Sexual activity: Never    Birth control/protection: Abstinence  Lifestyle  . Physical activity    Days per week: Not on file    Minutes per session: Not on file  . Stress: Not on file  Relationships  . Social Musician on phone: Not on file    Gets together: Not on file    Attends religious service: Not on  file    Active member of club or organization: Not on file    Attends meetings of clubs or organizations: Not on file    Relationship status: Not on file  . Intimate partner violence    Fear of current or ex partner: Not on file    Emotionally abused: Not on file    Physically abused: Not on file    Forced sexual activity: Not on file  Other Topics Concern  . Not on file  Social History Narrative  . Not on file   Family History  Problem Relation Age of Onset  . Cancer Mother   . Hypertension Father   . Cancer Sister    History reviewed. No pertinent surgical history.   Mardella Layman, MD 12/30/18 [phone]

## 2018-12-31 ENCOUNTER — Ambulatory Visit (INDEPENDENT_AMBULATORY_CARE_PROVIDER_SITE_OTHER): Payer: BC Managed Care – PPO

## 2018-12-31 ENCOUNTER — Ambulatory Visit: Payer: BLUE CROSS/BLUE SHIELD | Admitting: Family Medicine

## 2018-12-31 ENCOUNTER — Encounter: Payer: Self-pay | Admitting: Family Medicine

## 2018-12-31 VITALS — BP 160/102 | HR 77 | Temp 99.6°F | Ht 67.0 in | Wt 201.6 lb

## 2018-12-31 DIAGNOSIS — M25551 Pain in right hip: Secondary | ICD-10-CM | POA: Diagnosis not present

## 2018-12-31 DIAGNOSIS — S79911A Unspecified injury of right hip, initial encounter: Secondary | ICD-10-CM | POA: Diagnosis not present
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
## 2019-01-07 ENCOUNTER — Encounter: Payer: Self-pay | Admitting: Family Medicine

## 2019-01-10 MED ORDER — DICLOFENAC SODIUM 75 MG PO TBEC: 75.0000 mg | DELAYED_RELEASE_TABLET | Freq: Two times a day (BID) | ORAL | 2 refills | Status: DC

## 2019-02-04 ENCOUNTER — Encounter: Payer: Self-pay | Admitting: Family Medicine

## 2019-02-04 MED ORDER — PREDNISONE 20 MG PO TABS: ORAL_TABLET | ORAL | 0 refills | Status: DC

## 2019-02-16 ENCOUNTER — Other Ambulatory Visit: Payer: Self-pay

## 2019-02-16 ENCOUNTER — Encounter: Payer: Self-pay | Admitting: Family Medicine

## 2019-02-16 ENCOUNTER — Ambulatory Visit: Payer: BC Managed Care – PPO | Admitting: Family Medicine

## 2019-02-16 VITALS — BP 148/96 | HR 72 | Temp 98.4°F | Ht 67.0 in | Wt 205.0 lb

## 2019-02-16 DIAGNOSIS — R030 Elevated blood-pressure reading, without diagnosis of hypertension: Secondary | ICD-10-CM

## 2019-02-16 DIAGNOSIS — R29898 Other symptoms and signs involving the musculoskeletal system: Secondary | ICD-10-CM

## 2019-02-16 DIAGNOSIS — S3992XD Unspecified injury of lower back, subsequent encounter: Secondary | ICD-10-CM | POA: Diagnosis not present

## 2019-02-16 DIAGNOSIS — M5441 Lumbago with sciatica, right side: Secondary | ICD-10-CM | POA: Diagnosis not present

## 2019-02-16 MED ORDER — CLOTRIMAZOLE-BETAMETHASONE 1-0.05 % EX CREA: 1.0000 "application " | TOPICAL_CREAM | Freq: Two times a day (BID) | CUTANEOUS | 0 refills | Status: DC

## 2019-02-16 NOTE — Patient Instructions (Signed)
You will be contacted for your MRI appointment.    I have referred you next door for Physical therapy.  Ok to continue the diclofenac.  I will refer you to a specialist pending the MRI results.  Follow up with Dr Dettinger for your blood pressure.  It was a little high today.  Goal <140/90.
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
## 2019-02-23 ENCOUNTER — Ambulatory Visit: Payer: BC Managed Care – PPO | Attending: Family Medicine | Admitting: Physical Therapy

## 2019-02-23 ENCOUNTER — Encounter: Payer: Self-pay | Admitting: Physical Therapy

## 2019-02-23 ENCOUNTER — Other Ambulatory Visit: Payer: Self-pay

## 2019-02-23 DIAGNOSIS — G8929 Other chronic pain: Secondary | ICD-10-CM

## 2019-02-23 DIAGNOSIS — M6281 Muscle weakness (generalized): Secondary | ICD-10-CM | POA: Diagnosis not present

## 2019-02-23 DIAGNOSIS — M5441 Lumbago with sciatica, right side: Secondary | ICD-10-CM | POA: Diagnosis not present

## 2019-02-23 NOTE — Therapy (Signed)
[facility] [address] [place], [place], [address] Phone: [phone]   Fax:  [phone]  Physical Therapy Evaluation   Patient Details  Name: Nathaniel Miller MRN: [id number] Date of Birth: 03-29-1970 Referring Provider (PT): Delynn Flavin, DO   Encounter Date: 02/23/2019  PT End of Session - 02/23/19 2209    Visit Number  1    Number of Visits  12    Date for PT Re-Evaluation  04/13/19    Authorization Type  FOTO every 5th visit, progress note every 10th visit    PT Start Time  0945    PT Stop Time  1030    PT Time Calculation (min)  45 min    Activity Tolerance  Patient tolerated treatment well;Patient limited by pain    Behavior During Therapy  [facility] for tasks assessed/performed       History reviewed. No pertinent past medical history.  History reviewed. No pertinent surgical history.  There were no vitals filed for this visit.   Subjective Assessment - 02/23/19 2148    Subjective  COVID-19 screening performed upon arrival.Patient arrives to physical therapy with reports of low back pain, neurological symptoms to right mid thigh, and bilateral weakness that began in November 2020. Patient reports pain with walking, bearing weight on right LE, and pain with performing exercise program. Patient reports pain at worst as 10/10 and pain at best as 1/10 with medication and rest. Patient's goals are to decrease pain, improve movement, improve strength, stand longer, have less difficulties with home and work activities.    Limitations  Walking;Lifting    Diagnostic tests  see imaging tab    Patient Stated Goals  decrease pain, return to exercise program    Currently in Pain?  Yes    Pain Score  7     Pain Location  Back    Pain Orientation  Right;Lower    Pain Descriptors / Indicators  Sharp;Shooting;Numbness    Pain Type  Chronic pain    Pain Radiating Towards  right posterior mid thigh    Pain Onset  More than a month ago    Pain Frequency  Constant    Aggravating Factors   lifting, walking    Pain Relieving Factors  sitting, laying on back    Effect of Pain on Daily Activities  "constant pain"         [facility] PT Assessment - 02/23/19 0001      Assessment   Medical Diagnosis  Acute bilateral low back pain with right sided sciatica, weakness of both legs    Referring Provider (PT)  Delynn Flavin, DO    Onset Date/Surgical Date  --   Early November 2020   Next MD Visit  none scheduled    Prior Therapy  no      Precautions   Precautions  None      Restrictions   Weight Bearing Restrictions  No      Balance Screen   Has the patient fallen in the past 6 months  No    Has the patient had a decrease in activity level because of a fear of falling?   No    Is the patient reluctant to leave their home because of a fear of falling?   No      Home Public house manager residence      Prior Function   Level of Independence  Independent      Observation/Other  Assessments   Observations  stands with weight shifted to left, trunk flexed and left lateral trunk shift    Focus on Therapeutic Outcomes (FOTO)   59% limited      Deep Tendon Reflexes   DTR Assessment Site  Patella;Achilles    Patella DTR  1+    Achilles DTR  2+      ROM / Strength   AROM / PROM / Strength  AROM;Strength      AROM   Overall AROM   Due to pain    AROM Assessment Site  Lumbar    Lumbar Flexion  4" finger tip to floor    Lumbar - Right Side Bend  20" finger tip to floor    Lumbar - Left Side Bend  20.5" finger tio to floor      Palpation   Palpation comment  Tender to palpation to right paraspinals, right QL, and right upper glute region      Ambulation/Gait   Gait Pattern  Step-through pattern;Trunk flexed;Decreased weight shift to right                Objective measurements completed on examination: See above findings.              PT Education - 02/23/19 2205    Education  Details  draw ins, prone lay, prone on elbows as progression, glute sets    Person(s) Educated  Patient    Methods  Explanation;Handout;Demonstration    Comprehension  Verbalized understanding;Returned demonstration          PT Long Term Goals - 02/23/19 2213      PT LONG TERM GOAL #1   Title  Patient will be independent with HEP    Time  6    Period  Weeks    Status  New      PT LONG TERM GOAL #2   Title  Patient will demonstrate 4+/5 bilateral LE MMT in all planes to improve stability during functional tasks.    Time  6    Period  Weeks    Status  New      PT LONG TERM GOAL #3   Title  Patient will report an elimination of right LE neurological symptoms to indicate no nerve irritation.    Time  6    Period  Weeks    Status  New      PT LONG TERM GOAL #4   Title  Patient will report ability to perform ADLs and exercise routine with low back pain less than or equal to 3/10.    Time  6    Period  Weeks    Status  New             Plan - 02/23/19 2242    Clinical Impression Statement  Patient is a 49 year old male who presents to physical therapy with low back pain, neurological symptoms down right LE, and bilateral weakness that began early November 2020. Patient observed with left weight shift and left lateral trunk lean. Patient noted with increased right QL tone upon palpation. Patient and PT discussed HEP as well as plan of care to address deficits. Patient reported agreement and understanding. Patient would benefit from skilled physical therapy to address deficits and goals.    Personal Factors and Comorbidities  Age;Time since onset of injury/illness/exacerbation    Stability/Clinical Decision Making  Stable/Uncomplicated    Clinical Decision Making  Low    Rehab Potential  Good  PT Frequency  2x / week    PT Duration  6 weeks    PT Treatment/Interventions  ADLs/Self Care Home Management;Cryotherapy;Electrical Stimulation;Ultrasound;Traction;Moist  Heat;Iontophoresis 4mg /ml Dexamethasone;Gait training;Stair training;Functional mobility training;Therapeutic activities;Therapeutic exercise;Neuromuscular re-education;Manual techniques;Balance training;Patient/family education;Passive range of motion;Dry needling;Taping    PT Next Visit Plan  bike, core strenghtening and stability, LE strengthening, mckenzie extension exercises, modalities PRN for pain relief    PT Home Exercise Plan  see patient education section    Consulted and Agree with Plan of Care  Patient       Patient will benefit from skilled therapeutic intervention in order to improve the following deficits and impairments:  Abnormal gait, Decreased range of motion, Difficulty walking, Decreased strength, Decreased activity tolerance, Postural dysfunction, Pain  Visit Diagnosis: Chronic bilateral low back pain with right-sided sciatica - Plan: PT plan of care cert/re-cert  Muscle weakness (generalized) - Plan: PT plan of care cert/re-cert     Problem List Patient Active Problem List   Diagnosis Date Noted  . Migraine without aura and without status migrainosus, not intractable 02/02/2017  . Dermatophytosis of groin and perianal area 04/13/2012    Gabriela Eves, PT, DPT 02/23/2019, 10:59 PM  [facility] [place], [place], [address] Phone: [phone]   Fax:  [phone]  Name: Nathaniel Miller MRN: [id number] Date of Birth: 1971/01/25

## 2019-02-24 ENCOUNTER — Encounter: Payer: Self-pay | Admitting: Physical Therapy

## 2019-02-24 ENCOUNTER — Other Ambulatory Visit: Payer: Self-pay

## 2019-02-24 ENCOUNTER — Ambulatory Visit: Payer: BC Managed Care – PPO | Admitting: Physical Therapy

## 2019-02-24 DIAGNOSIS — M6281 Muscle weakness (generalized): Secondary | ICD-10-CM | POA: Diagnosis not present

## 2019-02-24 DIAGNOSIS — G8929 Other chronic pain: Secondary | ICD-10-CM | POA: Diagnosis not present

## 2019-02-24 DIAGNOSIS — M5441 Lumbago with sciatica, right side: Secondary | ICD-10-CM

## 2019-02-24 NOTE — Therapy (Signed)
[facility] [address] [place], [place], [address] Phone: [phone]   Fax:  [phone]  Physical Therapy Treatment  Patient Details  Name: Nathaniel Miller MRN: [id number] Date of Birth: 08-23-1970 Referring Provider (PT): Delynn Flavin, DO   Encounter Date: 02/24/2019  PT End of Session - 02/24/19 1350    Visit Number  2    Number of Visits  12    Date for PT Re-Evaluation  04/13/19    Authorization Type  FOTO every 5th visit, progress note every 10th visit    PT Start Time  1348    PT Stop Time  1438    PT Time Calculation (min)  50 min    Activity Tolerance  Patient tolerated treatment well;Patient limited by pain    Behavior During Therapy  [facility] for tasks assessed/performed       History reviewed. No pertinent past medical history.  History reviewed. No pertinent surgical history.  There were no vitals filed for this visit.  Subjective Assessment - 02/24/19 1349    Subjective  COVID 19 screening performed on patient upon arrival. Patient reports more pain in B calves when standing but decreases with walking and movement.    Limitations  Walking;Lifting    Diagnostic tests  see imaging tab    Patient Stated Goals  decrease pain, return to exercise program    Currently in Pain?  Yes    Pain Score  3     Pain Location  Back    Pain Orientation  Lower    Pain Descriptors / Indicators  Discomfort    Pain Type  Chronic pain    Pain Radiating Towards  B calves    Pain Onset  More than a month ago    Pain Frequency  Constant         [facility] PT Assessment - 02/24/19 0001      Assessment   Medical Diagnosis  Acute bilateral low back pain with right sided sciatica, weakness of both legs    Referring Provider (PT)  Delynn Flavin, DO    Next MD Visit  none scheduled    Prior Therapy  no      Precautions   Precautions  None      Restrictions   Weight Bearing Restrictions  No                   [facility]  Adult PT Treatment/Exercise - 02/24/19 0001      Exercises   Exercises  Lumbar      Lumbar Exercises: Stretches   Passive Hamstring Stretch  Right;Left;3 reps;30 seconds    Figure 4 Stretch  3 reps;30 seconds;Supine;Without overpressure    Figure 4 Stretch Limitations  BLE      Lumbar Exercises: Aerobic   Recumbent Bike  L3 x10 min      Lumbar Exercises: Standing   Other Standing Lumbar Exercises  Rockerboard x4 min for calf stretch      Modalities   Modalities  Traction      Traction   Type of Traction  Lumbar    Min (lbs)  5    Max (lbs)  80    Hold Time  99    Rest Time  5    Time  15                  PT Long Term Goals - 02/23/19 2213      PT LONG TERM GOAL #  1   Title  Patient will be independent with HEP    Time  6    Period  Weeks    Status  New      PT LONG TERM GOAL #2   Title  Patient will demonstrate 4+/5 bilateral LE MMT in all planes to improve stability during functional tasks.    Time  6    Period  Weeks    Status  New      PT LONG TERM GOAL #3   Title  Patient will report an elimination of right LE neurological symptoms to indicate no nerve irritation.    Time  6    Period  Weeks    Status  New      PT LONG TERM GOAL #4   Title  Patient will report ability to perform ADLs and exercise routine with low back pain less than or equal to 3/10.    Time  6    Period  Weeks    Status  New            Plan - 02/24/19 1427    Clinical Impression Statement  Patient presented in clinic with 3/10 LBP with radicular pain to B calves. Patient progressed through lumbar and LE stretching to relieve pain and radicular symptoms. Greater musclar tightness noted by patient in RLE. Mechanical lumbar traction initiated at 80# with patient educated in rationale for traction. Patient tolerated mechanical traction well with no complaints upon standing at end of session. Patient educated to monitor his symptoms until next PT visit.    Personal Factors and  Comorbidities  Age;Time since onset of injury/illness/exacerbation    Stability/Clinical Decision Making  Stable/Uncomplicated    Rehab Potential  Good    PT Frequency  2x / week    PT Duration  6 weeks    PT Treatment/Interventions  ADLs/Self Care Home Management;Cryotherapy;Electrical Stimulation;Ultrasound;Traction;Moist Heat;Iontophoresis 4mg /ml Dexamethasone;Gait training;Stair training;Functional mobility training;Therapeutic activities;Therapeutic exercise;Neuromuscular re-education;Manual techniques;Balance training;Patient/family education;Passive range of motion;Dry needling;Taping    PT Next Visit Plan  bike, core strenghtening and stability, LE strengthening, mckenzie extension exercises, modalities PRN for pain relief    PT Home Exercise Plan  see patient education section    Consulted and Agree with Plan of Care  Patient       Patient will benefit from skilled therapeutic intervention in order to improve the following deficits and impairments:  Abnormal gait, Decreased range of motion, Difficulty walking, Decreased strength, Decreased activity tolerance, Postural dysfunction, Pain  Visit Diagnosis: Chronic bilateral low back pain with right-sided sciatica  Muscle weakness (generalized)     Problem List Patient Active Problem List   Diagnosis Date Noted  . Migraine without aura and without status migrainosus, not intractable 02/02/2017  . Dermatophytosis of groin and perianal area 04/13/2012    Standley Brooking, PTA 02/24/2019, 2:42 PM  [facility] [place], [place], [address] Phone: [phone]   Fax:  [phone]  Name: Nathaniel Miller MRN: [id number] Date of Birth: 1970/07/30

## 2019-03-01 ENCOUNTER — Ambulatory Visit: Payer: BC Managed Care – PPO | Admitting: *Deleted

## 2019-03-01 ENCOUNTER — Other Ambulatory Visit: Payer: Self-pay

## 2019-03-01 DIAGNOSIS — M5441 Lumbago with sciatica, right side: Secondary | ICD-10-CM | POA: Diagnosis not present

## 2019-03-01 DIAGNOSIS — M6281 Muscle weakness (generalized): Secondary | ICD-10-CM

## 2019-03-01 DIAGNOSIS — G8929 Other chronic pain: Secondary | ICD-10-CM

## 2019-03-01 NOTE — Therapy (Signed)
[facility] [place], [place], [address] Phone: [phone]   Fax:  [phone]  Physical Therapy Treatment  Patient Details  Name: Nathaniel Miller MRN: [id number] Date of Birth: 09/27/1970 Referring Provider (PT): Ronnie Doss, DO   Encounter Date: 03/01/2019  PT End of Session - 03/01/19 1744    Visit Number  3    Number of Visits  12    Date for PT Re-Evaluation  04/13/19    Authorization Type  FOTO every 5th visit, progress note every 10th visit    PT Start Time  1345    PT Stop Time  1440    PT Time Calculation (min)  55 min       No past medical history on file.  No past surgical history on file.  There were no vitals filed for this visit.  Subjective Assessment - 03/01/19 1404    Subjective  COVID 19 screening performed on patient upon arrival. Patient reports more pain in B calves when standing but decreases with walking and movement.    Limitations  Walking;Lifting    Diagnostic tests  see imaging tab    Patient Stated Goals  decrease pain, return to exercise program    Currently in Pain?  Yes    Pain Score  3     Pain Location  Back    Pain Orientation  Lower    Pain Descriptors / Indicators  Discomfort    Pain Type  Chronic pain    Pain Onset  More than a month ago                       [facility] Adult PT Treatment/Exercise - 03/01/19 0001      Exercises   Exercises  Lumbar      Lumbar Exercises: Stretches   Other Lumbar Stretch Exercise  Discussed prone progression exs and Pt still with some LE symptoms while performing and are on hold right now. Pt to continue with LE stretches at this time.      Lumbar Exercises: Aerobic   Recumbent Bike  L3 x17 min      Lumbar Exercises: Standing   Other Standing Lumbar Exercises  Rockerboard x4 min for calf stretch      Modalities   Modalities  Traction      Traction   Type of Traction  Lumbar    Min (lbs)  5    Max (lbs)  80    Hold Time  99    Rest Time  5    Time  15                  PT Long Term Goals - 02/23/19 2213      PT LONG TERM GOAL #1   Title  Patient will be independent with HEP    Time  6    Period  Weeks    Status  New      PT LONG TERM GOAL #2   Title  Patient will demonstrate 4+/5 bilateral LE MMT in all planes to improve stability during functional tasks.    Time  6    Period  Weeks    Status  New      PT LONG TERM GOAL #3   Title  Patient will report an elimination of right LE neurological symptoms to indicate no nerve irritation.    Time  6    Period  Weeks  Status  New      PT LONG TERM GOAL #4   Title  Patient will report ability to perform ADLs and exercise routine with low back pain less than or equal to 3/10.    Time  6    Period  Weeks    Status  New            Plan - 03/01/19 1745    Clinical Impression Statement  Pt arrived today doing fairly well and reports that traction did help and he is having an MRI  of his LB tomorrow. He did fairly well with therex today without increased pain and traction was performed at 90#s today.    Personal Factors and Comorbidities  Age;Time since onset of injury/illness/exacerbation    Stability/Clinical Decision Making  Stable/Uncomplicated    Rehab Potential  Good    PT Frequency  2x / week    PT Duration  6 weeks    PT Treatment/Interventions  ADLs/Self Care Home Management;Cryotherapy;Electrical Stimulation;Ultrasound;Traction;Moist Heat;Iontophoresis 4mg /ml Dexamethasone;Gait training;Stair training;Functional mobility training;Therapeutic activities;Therapeutic exercise;Neuromuscular re-education;Manual techniques;Balance training;Patient/family education;Passive range of motion;Dry needling;Taping    PT Home Exercise Plan  see patient education section    Consulted and Agree with Plan of Care  Patient       Patient will benefit from skilled therapeutic intervention in order to improve the following deficits  and impairments:  Abnormal gait, Decreased range of motion, Difficulty walking, Decreased strength, Decreased activity tolerance, Postural dysfunction, Pain  Visit Diagnosis: Chronic bilateral low back pain with right-sided sciatica  Muscle weakness (generalized)     Problem List Patient Active Problem List   Diagnosis Date Noted  . Migraine without aura and without status migrainosus, not intractable 02/02/2017  . Dermatophytosis of groin and perianal area 04/13/2012    Tensley Wery,CHRIS, PTA 03/01/2019, 6:00 PM  [facility] [address] [place], [place], [place] Phone: [phone]   Fax:  [phone]  Name: LONNEY REVAK MRN: Marlyce Huge Date of Birth: [birth year]

## 2019-03-02 ENCOUNTER — Ambulatory Visit (HOSPITAL_COMMUNITY)
Admission: RE | Admit: 2019-03-02 | Discharge: 2019-03-02 | Disposition: A | Discharge status: Home / Self Care | Payer: BC Managed Care – PPO | Source: Ambulatory Visit | Attending: Family Medicine | Admitting: Family Medicine

## 2019-03-02 DIAGNOSIS — R29898 Other symptoms and signs involving the musculoskeletal system: Secondary | ICD-10-CM | POA: Diagnosis not present

## 2019-03-02 DIAGNOSIS — M5441 Lumbago with sciatica, right side: Secondary | ICD-10-CM | POA: Diagnosis not present

## 2019-03-02 DIAGNOSIS — S3992XD Unspecified injury of lower back, subsequent encounter: Secondary | ICD-10-CM

## 2019-03-02 DIAGNOSIS — M545 Low back pain: Secondary | ICD-10-CM | POA: Diagnosis not present

## 2019-03-03 ENCOUNTER — Encounter: Payer: Self-pay | Admitting: Physical Therapy

## 2019-03-03 ENCOUNTER — Ambulatory Visit: Payer: BC Managed Care – PPO | Admitting: Physical Therapy

## 2019-03-03 ENCOUNTER — Other Ambulatory Visit: Payer: Self-pay

## 2019-03-03 DIAGNOSIS — G8929 Other chronic pain: Secondary | ICD-10-CM | POA: Diagnosis not present

## 2019-03-03 DIAGNOSIS — M5441 Lumbago with sciatica, right side: Secondary | ICD-10-CM | POA: Diagnosis not present

## 2019-03-03 DIAGNOSIS — M6281 Muscle weakness (generalized): Secondary | ICD-10-CM | POA: Diagnosis not present

## 2019-03-03 NOTE — Therapy (Signed)
[facility] [place], [place], [address] Phone: [phone]   Fax:  [phone]  Physical Therapy Treatment  Patient Details  Name: Nathaniel Miller MRN: [id number] Date of Birth: 06/08/1970 Referring Provider (PT): Ronnie Doss, DO   Encounter Date: 03/03/2019  PT End of Session - 03/03/19 1406    Visit Number  4    Number of Visits  12    Date for PT Re-Evaluation  04/13/19    Authorization Type  FOTO every 5th visit, progress note every 10th visit    PT Start Time  1351    PT Stop Time  1445    PT Time Calculation (min)  54 min    Activity Tolerance  Patient tolerated treatment well    Behavior During Therapy  [facility] for tasks assessed/performed       History reviewed. No pertinent past medical history.  History reviewed. No pertinent surgical history.  There were no vitals filed for this visit.  Subjective Assessment - 03/03/19 1406    Subjective  COVID 19 screening performed on patient upon arrival. Patient reports no pain today and thinks traction is really helping.    Limitations  Walking;Lifting    Diagnostic tests  see imaging tab    Patient Stated Goals  decrease pain, return to exercise program    Currently in Pain?  No/denies         [facility] PT Assessment - 03/03/19 0001      Assessment   Medical Diagnosis  Acute bilateral low back pain with right sided sciatica, weakness of both legs    Referring Provider (PT)  Ronnie Doss, DO    Next MD Visit  none scheduled    Prior Therapy  no      Precautions   Precautions  None      Restrictions   Weight Bearing Restrictions  No                   [facility] Adult PT Treatment/Exercise - 03/03/19 0001      Lumbar Exercises: Aerobic   Recumbent Bike  L3 x11 min      Lumbar Exercises: Machines for Strengthening   Cybex Lumbar Extension  70# 2x10 reps      Lumbar Exercises: Standing   Forward Lunge  10 reps;2 seconds   with 6# reachouts    Wall Slides  15 reps;5 seconds    Other Standing Lumbar Exercises  Rockerboard x3 min for calf stretch    Other Standing Lumbar Exercises  B chop wood orange XTS x15 reps       Modalities   Modalities  Traction      Traction   Type of Traction  Lumbar    Min (lbs)  5    Max (lbs)  85    Hold Time  99    Rest Time  5    Time  15                  PT Long Term Goals - 03/03/19 1421      PT LONG TERM GOAL #1   Title  Patient will be independent with HEP    Time  6    Period  Weeks    Status  On-going      PT LONG TERM GOAL #2   Title  Patient will demonstrate 4+/5 bilateral LE MMT in all planes to improve stability during functional tasks.    Time  6    Period  Weeks    Status  On-going      PT LONG TERM GOAL #3   Title  Patient will report an elimination of right LE neurological symptoms to indicate no nerve irritation.    Time  6    Period  Weeks    Status  Achieved      PT LONG TERM GOAL #4   Title  Patient will report ability to perform ADLs and exercise routine with low back pain less than or equal to 3/10.    Time  6    Period  Weeks    Status  Achieved            Plan - 03/03/19 1443    Clinical Impression Statement  Patient arrived today with no complaints of LE radicular pain and no limitations at work today. Patient reports being able to stand more erect upon waking since beginning PT as well. More functional core/lumbar strengthening completed today with instruction for core activation as well. No complaints of any increased LBP during therex. Mechanical lumbar traction increased to 85# max today.    Personal Factors and Comorbidities  Age;Time since onset of injury/illness/exacerbation    Stability/Clinical Decision Making  Stable/Uncomplicated    Rehab Potential  Good    PT Frequency  2x / week    PT Duration  6 weeks    PT Treatment/Interventions  ADLs/Self Care Home Management;Cryotherapy;Electrical  Stimulation;Ultrasound;Traction;Moist Heat;Iontophoresis 4mg /ml Dexamethasone;Gait training;Stair training;Functional mobility training;Therapeutic activities;Therapeutic exercise;Neuromuscular re-education;Manual techniques;Balance training;Patient/family education;Passive range of motion;Dry needling;Taping    PT Next Visit Plan  Discuss posture/ADLs handout as well as lifting technique when appropriate via symptoms/pain.    PT Home Exercise Plan  see patient education section    Consulted and Agree with Plan of Care  Patient       Patient will benefit from skilled therapeutic intervention in order to improve the following deficits and impairments:  Abnormal gait, Decreased range of motion, Difficulty walking, Decreased strength, Decreased activity tolerance, Postural dysfunction, Pain  Visit Diagnosis: Chronic bilateral low back pain with right-sided sciatica  Muscle weakness (generalized)     Problem List Patient Active Problem List   Diagnosis Date Noted  . Migraine without aura and without status migrainosus, not intractable 02/02/2017  . Dermatophytosis of groin and perianal area 04/13/2012    04/15/2012, PTA 03/03/2019, 4:00 PM  [facility] [address] [place], [place], [place] Phone: [phone]   Fax:  [phone]  Name: Nathaniel Miller MRN: Marlyce Huge Date of Birth: 11-08-1970

## 2019-03-07 ENCOUNTER — Telehealth: Payer: Self-pay | Admitting: Family Medicine

## 2019-03-07 DIAGNOSIS — M48061 Spinal stenosis, lumbar region without neurogenic claudication: Secondary | ICD-10-CM

## 2019-03-07 NOTE — Telephone Encounter (Signed)
-----   Message from Angela Adam, [place] sent at 03/03/2019  3:45 PM EST ----- Would like referral if needed- please place

## 2019-03-07 NOTE — Telephone Encounter (Signed)
Referral to neurosurgery placed for the patient

## 2019-03-08 ENCOUNTER — Encounter: Payer: Self-pay | Admitting: Physical Therapy

## 2019-03-08 ENCOUNTER — Other Ambulatory Visit: Payer: Self-pay

## 2019-03-08 ENCOUNTER — Ambulatory Visit: Payer: BC Managed Care – PPO | Admitting: Physical Therapy

## 2019-03-08 DIAGNOSIS — M5441 Lumbago with sciatica, right side: Secondary | ICD-10-CM

## 2019-03-08 DIAGNOSIS — G8929 Other chronic pain: Secondary | ICD-10-CM

## 2019-03-08 DIAGNOSIS — M6281 Muscle weakness (generalized): Secondary | ICD-10-CM

## 2019-03-08 NOTE — Therapy (Signed)
[facility] [place], [place], [address] Phone: [phone]   Fax:  [phone]  Physical Therapy Treatment  Patient Details  Name: ROSHAN ROBACK MRN: [id number] Date of Birth: 12-04-1970 Referring Provider (PT): Ronnie Doss, DO   Encounter Date: 03/08/2019  PT End of Session - 03/08/19 1350    Visit Number  5    Number of Visits  12    Date for PT Re-Evaluation  04/13/19    Authorization Type  FOTO every 5th visit, progress note every 10th visit    PT Start Time  1344    PT Stop Time  1438    PT Time Calculation (min)  54 min    Activity Tolerance  Patient tolerated treatment well    Behavior During Therapy  [facility] for tasks assessed/performed       History reviewed. No pertinent past medical history.  History reviewed. No pertinent surgical history.  There were no vitals filed for this visit.  Subjective Assessment - 03/08/19 1350    Subjective  COVID 19 screening performed on patient upon arrival. Patient reports no pain today and thinks traction is really helping. Reports only numbness from B knees to feet in posterior aspect just while he is standing.    Limitations  Walking;Lifting    Diagnostic tests  see imaging tab    Patient Stated Goals  decrease pain, return to exercise program    Currently in Pain?  No/denies         [facility] PT Assessment - 03/08/19 0001      Assessment   Medical Diagnosis  Acute bilateral low back pain with right sided sciatica, weakness of both legs    Referring Provider (PT)  Ronnie Doss, DO    Next MD Visit  none scheduled    Prior Therapy  no      Precautions   Precautions  None      Restrictions   Weight Bearing Restrictions  No                   [facility] Adult PT Treatment/Exercise - 03/08/19 0001      Lumbar Exercises: Aerobic   Recumbent Bike  L4 x16 min      Lumbar Exercises: Machines for Strengthening   Cybex Lumbar Extension  70# 3x10 reps     Leg Press  2 pl, seat 7 x20 reps with core activation      Lumbar Exercises: Standing   Forward Lunge  15 reps;2 seconds   4# reachouts for core   Row  Strengthening;Both;20 reps;Limitations    Row Limitations  Blue XTS    Shoulder Extension  Strengthening;Both;20 reps      Lumbar Exercises: Prone   Straight Leg Raise  20 reps   focus on lumbar stabilization     Modalities   Modalities  Traction      Traction   Type of Traction  Lumbar    Min (lbs)  5    Max (lbs)  85    Hold Time  99    Rest Time  5    Time  15                  PT Long Term Goals - 03/03/19 1421      PT LONG TERM GOAL #1   Title  Patient will be independent with HEP    Time  6    Period  Weeks    Status  On-going      PT LONG TERM GOAL #2   Title  Patient will demonstrate 4+/5 bilateral LE MMT in all planes to improve stability during functional tasks.    Time  6    Period  Weeks    Status  On-going      PT LONG TERM GOAL #3   Title  Patient will report an elimination of right LE neurological symptoms to indicate no nerve irritation.    Time  6    Period  Weeks    Status  Achieved      PT LONG TERM GOAL #4   Title  Patient will report ability to perform ADLs and exercise routine with low back pain less than or equal to 3/10.    Time  6    Period  Weeks    Status  Achieved            Plan - 03/08/19 1436    Clinical Impression Statement  Patient presented in clinic with reports of only numbness along B posterior calves but only when standing. Patient only experiencing radicular pain occasionally and able to stand more erect per patient report. Patient guided through more functional strengthening with core activation as well. Mechanical lumbar traction maintained at 85# max with no complaints following end of treatment.    Personal Factors and Comorbidities  Age;Time since onset of injury/illness/exacerbation    Stability/Clinical Decision Making  Stable/Uncomplicated     Rehab Potential  Good    PT Frequency  2x / week    PT Duration  6 weeks    PT Treatment/Interventions  ADLs/Self Care Home Management;Cryotherapy;Electrical Stimulation;Ultrasound;Traction;Moist Heat;Iontophoresis 4mg /ml Dexamethasone;Gait training;Stair training;Functional mobility training;Therapeutic activities;Therapeutic exercise;Neuromuscular re-education;Manual techniques;Balance training;Patient/family education;Passive range of motion;Dry needling;Taping    PT Next Visit Plan  Discuss posture/ADLs handout as well as lifting technique when appropriate via symptoms/pain.    PT Home Exercise Plan  see patient education section    Consulted and Agree with Plan of Care  Patient       Patient will benefit from skilled therapeutic intervention in order to improve the following deficits and impairments:  Abnormal gait, Decreased range of motion, Difficulty walking, Decreased strength, Decreased activity tolerance, Postural dysfunction, Pain  Visit Diagnosis: Chronic bilateral low back pain with right-sided sciatica  Muscle weakness (generalized)     Problem List Patient Active Problem List   Diagnosis Date Noted  . Migraine without aura and without status migrainosus, not intractable 02/02/2017  . Dermatophytosis of groin and perianal area 04/13/2012    04/15/2012, PTA 03/08/2019, 2:43 PM  [facility] [address] [place], [place], [place] Phone: [phone]   Fax:  [phone]  Name: ANDERSEN IORIO MRN: Marlyce Huge Date of Birth: 1970/07/04

## 2019-03-10 ENCOUNTER — Ambulatory Visit: Payer: BC Managed Care – PPO | Admitting: Physical Therapy

## 2019-03-10 ENCOUNTER — Other Ambulatory Visit: Payer: Self-pay

## 2019-03-10 ENCOUNTER — Encounter: Payer: Self-pay | Admitting: Physical Therapy

## 2019-03-10 DIAGNOSIS — M5441 Lumbago with sciatica, right side: Secondary | ICD-10-CM | POA: Diagnosis not present

## 2019-03-10 DIAGNOSIS — M5126 Other intervertebral disc displacement, lumbar region: Secondary | ICD-10-CM | POA: Diagnosis not present

## 2019-03-10 DIAGNOSIS — G8929 Other chronic pain: Secondary | ICD-10-CM | POA: Diagnosis not present

## 2019-03-10 DIAGNOSIS — M6281 Muscle weakness (generalized): Secondary | ICD-10-CM | POA: Diagnosis not present

## 2019-03-10 NOTE — Therapy (Signed)
[facility] [place], [place], [address] Phone: [phone]   Fax:  [phone]  Physical Therapy Treatment PHYSICAL THERAPY DISCHARGE SUMMARY  Visits from Start of Care: 6  Current functional level related to goals / functional outcomes: See below   Remaining deficits: See goals   Education / Equipment: HEP Plan: Patient agrees to discharge.  Patient goals were partially met. Patient is being discharged due to the patient's request.  ?????   Patient will be getting lumbar surgery on 03/14/2019. Gabriela Eves, PT, DPT    Patient Details  Name: Nathaniel Miller MRN: [id number] Date of Birth: [birth year] Referring Provider (PT): Ronnie Doss, DO   Encounter Date: 03/10/2019  PT End of Session - 03/10/19 1403    Visit Number  6    Number of Visits  12    Date for PT Re-Evaluation  04/13/19    Authorization Type  FOTO every 5th visit, progress note every 10th visit    PT Start Time  1346    PT Stop Time  1433    PT Time Calculation (min)  47 min    Activity Tolerance  Patient tolerated treatment well    Behavior During Therapy  [facility] for tasks assessed/performed       History reviewed. No pertinent past medical history.  History reviewed. No pertinent surgical history.  There were no vitals filed for this visit.  Subjective Assessment - 03/10/19 1359    Subjective  COVID 19 screening performed on patient upon arrival. Patient reports his surgeon talked him into having surgery on Monday to prevent any excessive damage. Reports he was taking 2 Diclofenac prior to coming to PT but is now down to 1 every other day since beginning PT.    Limitations  Walking;Lifting    Diagnostic tests  see imaging tab    Patient Stated Goals  decrease pain, return to exercise program    Currently in Pain?  Yes    Pain Score  1     Pain Location  Back    Pain Orientation  Lower    Pain Descriptors / Indicators  Discomfort    Pain Type  Chronic pain    Pain Onset  More than a month ago    Pain Frequency  Constant         [facility] PT Assessment - 03/10/19 0001      Assessment   Medical Diagnosis  Acute bilateral low back pain with right sided sciatica, weakness of both legs    Referring Provider (PT)  Ronnie Doss, DO    Next MD Visit  none scheduled    Prior Therapy  no      Precautions   Precautions  None      Restrictions   Weight Bearing Restrictions  No      Observation/Other Assessments   Focus on Therapeutic Outcomes (FOTO)   33%, CJ      ROM / Strength   AROM / PROM / Strength  Strength      Strength   Overall Strength  Within functional limits for tasks performed    Strength Assessment Site  Hip;Knee    Right/Left Hip  Right;Left    Right Hip Flexion  4/5    Right Hip ABduction  4/5    Left Hip Flexion  4+/5    Left Hip ABduction  4/5    Right/Left Knee  Right;Left    Right Knee Flexion  4/5  Right Knee Extension  4+/5    Left Knee Flexion  4/5    Left Knee Extension  4+/5                   [facility] Adult PT Treatment/Exercise - 03/10/19 0001      Lumbar Exercises: Aerobic   Recumbent Bike  L4 x15 min      Lumbar Exercises: Machines for Strengthening   Cybex Lumbar Extension  70# 3x10 reps    Leg Press  1.5 pl, seat 7 x30 reps with core activation      Lumbar Exercises: Standing   Forward Lunge  10 reps;Other (comment)   4# reachouts   Row  20 reps;Limitations    Shoulder Extension  Strengthening;Both;20 reps    Shoulder Extension Limitations  Blue XTS    Other Standing Lumbar Exercises  B chop wood blue XTS      Modalities   Modalities  Traction      Traction   Type of Traction  Lumbar    Min (lbs)  5    Max (lbs)  85    Hold Time  99    Rest Time  5    Time  15                  PT Long Term Goals - 03/10/19 1436      PT LONG TERM GOAL #1   Title  Patient will be independent with HEP    Time  6    Period  Weeks    Status   Partially Met      PT LONG TERM GOAL #2   Title  Patient will demonstrate 4+/5 bilateral LE MMT in all planes to improve stability during functional tasks.    Time  6    Period  Weeks    Status  Partially Met      PT LONG TERM GOAL #3   Title  Patient will report an elimination of right LE neurological symptoms to indicate no nerve irritation.    Time  6    Period  Weeks    Status  Partially Met   reduced intensity and frequency per patient report     PT LONG TERM GOAL #4   Title  Patient will report ability to perform ADLs and exercise routine with low back pain less than or equal to 3/10.    Time  6    Period  Weeks    Status  Achieved            Plan - 03/10/19 1501    Clinical Impression Statement  Patient presented in clinic with reports of improvement in regards to LBP and radicular pain. Patient scheduled for surgery by Dr. Saintclair Halsted on 03/14/2019. Patient has reported reduction in severity and frequency of radicular pain or numbness. Patient able to complete therex fairly well wtih VCs for core activation. Mechanical traction maintained at 85# max. Patient able to achieve or partially achieve all goals set at evaluation.    Personal Factors and Comorbidities  Age;Time since onset of injury/illness/exacerbation    Stability/Clinical Decision Making  Stable/Uncomplicated    Rehab Potential  Good    PT Frequency  2x / week    PT Duration  6 weeks    PT Treatment/Interventions  ADLs/Self Care Home Management;Cryotherapy;Electrical Stimulation;Ultrasound;Traction;Moist Heat;Iontophoresis 14m/ml Dexamethasone;Gait training;Stair training;Functional mobility training;Therapeutic activities;Therapeutic exercise;Neuromuscular re-education;Manual techniques;Balance training;Patient/family education;Passive range of motion;Dry needling;Taping    PT Next Visit Plan  Discuss posture/ADLs handout as well as lifting technique when appropriate via symptoms/pain.    PT Home Exercise Plan  see  patient education section    Consulted and Agree with Plan of Care  Patient       Patient will benefit from skilled therapeutic intervention in order to improve the following deficits and impairments:  Abnormal gait, Decreased range of motion, Difficulty walking, Decreased strength, Decreased activity tolerance, Postural dysfunction, Pain  Visit Diagnosis: Chronic bilateral low back pain with right-sided sciatica  Muscle weakness (generalized)     Problem List Patient Active Problem List   Diagnosis Date Noted  . Migraine without aura and without status migrainosus, not intractable 02/02/2017  . Dermatophytosis of groin and perianal area 04/13/2012    Standley Brooking, PTA 03/10/19 3:09 PM   [facility] [place], [place], [address] Phone: [phone]   Fax:  [phone]  Name: Nathaniel Miller MRN: [id number] Date of Birth: [birth year]

## 2019-03-14 DIAGNOSIS — M5116 Intervertebral disc disorders with radiculopathy, lumbar region: Secondary | ICD-10-CM | POA: Diagnosis not present

## 2019-03-14 DIAGNOSIS — G9741 Accidental puncture or laceration of dura during a procedure: Secondary | ICD-10-CM | POA: Diagnosis not present

## 2019-03-14 DIAGNOSIS — M5126 Other intervertebral disc displacement, lumbar region: Secondary | ICD-10-CM | POA: Diagnosis not present

## 2019-05-12 DIAGNOSIS — Z23 Encounter for immunization: Secondary | ICD-10-CM | POA: Diagnosis not present

## 2019-05-15 ENCOUNTER — Other Ambulatory Visit: Payer: Self-pay | Admitting: Family Medicine

## 2019-05-16 MED ORDER — CLOTRIMAZOLE-BETAMETHASONE 1-0.05 % EX CREA: 1.0000 "application " | TOPICAL_CREAM | Freq: Two times a day (BID) | CUTANEOUS | 0 refills | Status: DC

## 2019-05-31 DIAGNOSIS — E291 Testicular hypofunction: Secondary | ICD-10-CM | POA: Diagnosis not present

## 2019-06-04 DIAGNOSIS — Z23 Encounter for immunization: Secondary | ICD-10-CM | POA: Diagnosis not present

## 2019-06-13 DIAGNOSIS — R6882 Decreased libido: Secondary | ICD-10-CM | POA: Diagnosis not present

## 2019-06-13 DIAGNOSIS — E291 Testicular hypofunction: Secondary | ICD-10-CM | POA: Diagnosis not present

## 2019-06-13 DIAGNOSIS — F419 Anxiety disorder, unspecified: Secondary | ICD-10-CM | POA: Diagnosis not present

## 2019-07-11 DIAGNOSIS — R6882 Decreased libido: Secondary | ICD-10-CM | POA: Diagnosis not present

## 2019-07-11 DIAGNOSIS — F419 Anxiety disorder, unspecified: Secondary | ICD-10-CM | POA: Diagnosis not present

## 2019-07-11 DIAGNOSIS — E291 Testicular hypofunction: Secondary | ICD-10-CM | POA: Diagnosis not present

## 2019-07-13 DIAGNOSIS — E291 Testicular hypofunction: Secondary | ICD-10-CM | POA: Diagnosis not present

## 2019-07-25 ENCOUNTER — Other Ambulatory Visit: Payer: Self-pay | Admitting: Family Medicine

## 2019-07-26 MED ORDER — CLOTRIMAZOLE-BETAMETHASONE 1-0.05 % EX CREA: 1.0000 "application " | TOPICAL_CREAM | Freq: Two times a day (BID) | CUTANEOUS | 0 refills | Status: DC

## 2019-10-03 ENCOUNTER — Other Ambulatory Visit: Payer: Self-pay | Admitting: Family Medicine

## 2019-10-04 MED ORDER — CLOTRIMAZOLE-BETAMETHASONE 1-0.05 % EX CREA: 1.0000 "application " | TOPICAL_CREAM | Freq: Two times a day (BID) | CUTANEOUS | 0 refills | Status: DC

## 2019-10-06 DIAGNOSIS — E291 Testicular hypofunction: Secondary | ICD-10-CM | POA: Diagnosis not present

## 2019-10-10 DIAGNOSIS — E291 Testicular hypofunction: Secondary | ICD-10-CM | POA: Diagnosis not present

## 2019-10-10 DIAGNOSIS — F419 Anxiety disorder, unspecified: Secondary | ICD-10-CM | POA: Diagnosis not present

## 2019-10-10 DIAGNOSIS — E559 Vitamin D deficiency, unspecified: Secondary | ICD-10-CM | POA: Diagnosis not present

## 2019-10-10 DIAGNOSIS — R6882 Decreased libido: Secondary | ICD-10-CM | POA: Diagnosis not present

## 2019-10-13 ENCOUNTER — Ambulatory Visit (HOSPITAL_COMMUNITY)
Admission: EM | Admit: 2019-10-13 | Discharge: 2019-10-13 | Disposition: A | Discharge status: Home / Self Care | Payer: BC Managed Care – PPO | Attending: Family Medicine | Admitting: Family Medicine

## 2019-10-13 ENCOUNTER — Encounter (HOSPITAL_COMMUNITY): Payer: Self-pay | Admitting: Emergency Medicine

## 2019-10-13 ENCOUNTER — Other Ambulatory Visit: Payer: Self-pay

## 2019-10-13 DIAGNOSIS — R300 Dysuria: Secondary | ICD-10-CM | POA: Insufficient documentation

## 2019-10-13 MED ORDER — AZITHROMYCIN 250 MG PO TABS
1000.0000 mg | ORAL_TABLET | Freq: Once | ORAL | Status: AC
  Administered 2019-10-13: 1000 mg via ORAL

## 2019-10-13 MED ORDER — CEFTRIAXONE SODIUM 500 MG IJ SOLR
INTRAMUSCULAR | Status: AC
  Filled 2019-10-13: qty 500

## 2019-10-13 MED ORDER — AZITHROMYCIN 250 MG PO TABS
ORAL_TABLET | ORAL | Status: AC
  Filled 2019-10-13: qty 4

## 2019-10-13 MED ORDER — CEFTRIAXONE SODIUM 500 MG IJ SOLR
500.0000 mg | Freq: Once | INTRAMUSCULAR | Status: AC
  Administered 2019-10-13: 500 mg via INTRAMUSCULAR

## 2019-10-13 MED ORDER — LIDOCAINE HCL (PF) 1 % IJ SOLN
INTRAMUSCULAR | Status: AC
  Filled 2019-10-13: qty 2

## 2019-10-13 NOTE — ED Triage Notes (Signed)
Pt c/o rash on groin x 5 days ago. Pt states he has just tried a new weight loss medicine and thinks it may have caused the rash. He has stopped the medication for 2 days now and the rash seems to be getting better. Pt states the rash is painful.

## 2019-10-13 NOTE — ED Provider Notes (Signed)
[facility]    CSN: [id number] Arrival date & time: 10/13/19  1914      History   Chief Complaint Chief Complaint  Patient presents with  . Rash    HPI Nathaniel Miller is a 49 y.o. male.   Patient is a 49 year old male presents today with dysuria.  This started approximate 5 days ago.  Reporting mild irritation to meatus.  Patient just started a new weight loss medication and the dysuria started the next morning after starting this.  He has since stopped and the symptoms have resolved.  Believes it may be associated but is concerned for STDs.  Would like to be tested and treated.  He is currently sexually active.  No penile discharge, testicle pain or swelling     History reviewed. No pertinent past medical history.  Patient Active Problem List   Diagnosis Date Noted  . Migraine without aura and without status migrainosus, not intractable 02/02/2017  . Dermatophytosis of groin and perianal area 04/13/2012    History reviewed. No pertinent surgical history.     Home Medications    Prior to Admission medications   Medication Sig Start Date End Date Taking? Authorizing Provider  clotrimazole-betamethasone (LOTRISONE) cream Apply 1 application topically 2 (two) times daily. For up to 7 days. 10/04/19   Dettinger, Elige Radon, MD  diclofenac (VOLTAREN) 75 MG EC tablet Take 1 tablet (75 mg total) by mouth 2 (two) times daily. 01/10/19   Dettinger, Elige Radon, MD  topiramate (TOPAMAX) 50 MG tablet Take 1 tablet (50 mg total) by mouth 2 (two) times daily. 02/02/17 12/30/18  Dettinger, Elige Radon, MD    Family History Family History  Problem Relation Age of Onset  . Cancer Mother   . Hypertension Father   . Cancer Sister     Social History Social History   Tobacco Use  . Smoking status: Never Smoker  . Smokeless tobacco: Never Used  Vaping Use  . Vaping Use: Never used  Substance Use Topics  . Alcohol use: No  . Drug use: No     Allergies   Patient has  no known allergies.   Review of Systems Review of Systems   Physical Exam Triage Vital Signs ED Triage Vitals  Enc Vitals Group     BP 10/13/19 0908 (!) 161/104     Pulse Rate 10/13/19 0908 68     Resp 10/13/19 0908 16     Temp 10/13/19 0908 98.1 F (36.7 C)     Temp Source 10/13/19 0908 Oral     SpO2 10/13/19 0908 100 %     Weight --      Height --      Head Circumference --      Peak Flow --      Pain Score 10/13/19 0905 4     Pain Loc --      Pain Edu? --      Excl. in GC? --    No data found.  Updated Vital Signs BP (!) 161/104 (BP Location: Right Arm) Comment: provider notified  Pulse 68   Temp 98.1 F (36.7 C) (Oral)   Resp 16   SpO2 100%   Visual Acuity Right Eye Distance:   Left Eye Distance:   Bilateral Distance:    Right Eye Near:   Left Eye Near:    Bilateral Near:     Physical Exam Vitals and nursing note reviewed.  Constitutional:      Appearance: Normal  appearance.  HENT:     Head: Normocephalic and atraumatic.     Nose: Nose normal.  Eyes:     Conjunctiva/sclera: Conjunctivae normal.  Pulmonary:     Effort: Pulmonary effort is normal.  Genitourinary:    Penis: Normal.   Musculoskeletal:        General: Normal range of motion.     Cervical back: Normal range of motion.  Skin:    General: Skin is warm and dry.  Neurological:     Mental Status: He is alert.  Psychiatric:        Mood and Affect: Mood normal.      UC Treatments / Results  Labs (all labs ordered are listed, but only abnormal results are displayed) Labs Reviewed  CYTOLOGY, (ORAL, ANAL, URETHRAL) ANCILLARY ONLY    EKG   Radiology No results found.  Procedures Procedures (including critical care time)  Medications Ordered in UC Medications  azithromycin (ZITHROMAX) tablet 1,000 mg (has no administration in time range)  cefTRIAXone (ROCEPHIN) injection 500 mg (has no administration in time range)    Initial Impression / Assessment and Plan / UC Course   I have reviewed the triage vital signs and the nursing notes.  Pertinent labs & imaging results that were available during my care of the patient were reviewed by me and considered in my medical decision making (see chart for details).    Dysuria Normal genitourinary exam Patient concerned for STDs.  Swab sent for testing.  Would like to be treated prophylactically.  Treating for gonorrhea and chlamydia here today. Follow up as needed for continued or worsening symptoms  Final Clinical Impressions(s) / UC Diagnoses   Final diagnoses:  Dysuria     Discharge Instructions     Swab sent for testing.  Will call with any positive results.  Treating you for gonorrhea and chlamydia today prophylactically.    ED Prescriptions    None     PDMP not reviewed this encounter.   Dahlia Byes A, NP 10/13/19 [phone]

## 2019-10-13 NOTE — Discharge Instructions (Addendum)
Swab sent for testing.  Will call with any positive results.  Treating you for gonorrhea and chlamydia today prophylactically.

## 2019-10-14 LAB — CYTOLOGY, (ORAL, ANAL, URETHRAL) ANCILLARY ONLY
Chlamydia: POSITIVE — AB
Comment: NEGATIVE
Comment: NEGATIVE
Comment: NORMAL
Neisseria Gonorrhea: POSITIVE — AB
Trichomonas: POSITIVE — AB

## 2019-10-17 ENCOUNTER — Telehealth (HOSPITAL_COMMUNITY): Payer: Self-pay

## 2019-10-17 MED ORDER — METRONIDAZOLE 500 MG PO TABS: 500.0000 mg | ORAL_TABLET | Freq: Two times a day (BID) | ORAL | 0 refills | Status: DC

## 2019-10-21 ENCOUNTER — Encounter: Payer: Self-pay | Admitting: Family Medicine

## 2020-01-20 ENCOUNTER — Other Ambulatory Visit: Payer: Self-pay | Admitting: Family Medicine

## 2020-05-14 ENCOUNTER — Telehealth: Payer: Self-pay
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
## 2023-07-26 ENCOUNTER — Encounter (HOSPITAL_COMMUNITY): Payer: Self-pay

## 2023-07-26 ENCOUNTER — Ambulatory Visit (HOSPITAL_COMMUNITY)
Admission: EM | Admit: 2023-07-26 | Discharge: 2023-07-26 | Disposition: A | Discharge status: Home / Self Care | Attending: Physician Assistant | Admitting: Physician Assistant

## 2023-07-26 DIAGNOSIS — K047 Periapical abscess without sinus: Secondary | ICD-10-CM

## 2023-07-26 MED ORDER — HYDROCODONE-ACETAMINOPHEN 5-325 MG PO TABS: 1.0000 | ORAL_TABLET | ORAL | 0 refills | Status: DC | PRN

## 2023-07-26 MED ORDER — CLINDAMYCIN HCL 300 MG PO CAPS: 300.0000 mg | ORAL_CAPSULE | Freq: Three times a day (TID) | ORAL | 0 refills | Status: AC

## 2023-07-26 NOTE — Discharge Instructions (Signed)
See your dentist as scheduled.

## 2023-07-26 NOTE — ED Triage Notes (Signed)
 Patient here today with c/o upper dental pain X 2 week. Patient has been taking Ibuprofen with some relief.

## 2023-07-26 NOTE — ED Provider Notes (Signed)
 [facility]    CSN: [id number] Arrival date & time: 07/26/23  1656      History   Chief Complaint Chief Complaint  Patient presents with   Dental Pain    HPI Nathaniel Miller is a 53 y.o. male.   Patient complains of a toothache to the front of his upper mouth as well as swelling.  Patient states that his dentist has filled the area but he thinks the feeling was ground down by the dental hygienist.  Patient complains of swelling and pain.  He is scheduled to see the dentist on Tuesday for further evaluation.  Patient reports pain has become intense and he is has swelling.   Dental Pain   History reviewed. No pertinent past medical history.  Patient Active Problem List   Diagnosis Date Noted   Migraine without aura and without status migrainosus, not intractable 02/02/2017   Dermatophytosis of groin and perianal area 04/13/2012    History reviewed. No pertinent surgical history.     Home Medications    Prior to Admission medications   Medication Sig Start Date End Date Taking? Authorizing Provider  clindamycin (CLEOCIN) 300 MG capsule Take 1 capsule (300 mg total) by mouth 3 (three) times daily for 10 days. 07/26/23 08/05/23 Yes Jacquelina Hewins K, PA-C  HYDROcodone-acetaminophen (NORCO/VICODIN) 5-325 MG tablet Take 1 tablet by mouth every 4 (four) hours as needed. 07/26/23 07/25/24 Yes Aiyah Scarpelli K, PA-C  topiramate  (TOPAMAX ) 50 MG tablet Take 1 tablet (50 mg total) by mouth 2 (two) times daily. 02/02/17 12/30/18  Dettinger, Lucio Sabin, MD    Family History Family History  Problem Relation Age of Onset   Cancer Mother    Hypertension Father    Cancer Sister     Social History Social History   Tobacco Use   Smoking status: Never   Smokeless tobacco: Never  Vaping Use   Vaping status: Never Used  Substance Use Topics   Alcohol use: No   Drug use: No     Allergies   Patient has no known allergies.   Review of Systems Review of Systems  All other  systems reviewed and are negative.    Physical Exam Triage Vital Signs ED Triage Vitals  Encounter Vitals Group     BP 07/26/23 1718 (!) 175/98     Systolic BP Percentile --      Diastolic BP Percentile --      Pulse Rate 07/26/23 1718 73     Resp 07/26/23 1718 16     Temp 07/26/23 1718 98.7 F (37.1 C)     Temp Source 07/26/23 1718 Oral     SpO2 07/26/23 1718 98 %     Weight --      Height --      Head Circumference --      Peak Flow --      Pain Score 07/26/23 1715 7     Pain Loc --      Pain Education --      Exclude from Growth Chart --    No data found.  Updated Vital Signs BP (!) 175/98 (BP Location: Left Arm)   Pulse 73   Temp 98.7 F (37.1 C) (Oral)   Resp 16   SpO2 98%   Visual Acuity Right Eye Distance:   Left Eye Distance:   Bilateral Distance:    Right Eye Near:   Left Eye Near:    Bilateral Near:     Physical  Exam Vitals reviewed.  Constitutional:      Appearance: Normal appearance.  HENT:     Head: Normocephalic.     Nose: Nose normal.     Mouth/Throat:     Mouth: Mucous membranes are moist.     Comments: Swollen right face, swelling gumline no obvious abscess. Musculoskeletal:        General: Normal range of motion.  Skin:    General: Skin is warm.  Neurological:     General: No focal deficit present.     Mental Status: He is alert.      UC Treatments / Results  Labs (all labs ordered are listed, but only abnormal results are displayed) Labs Reviewed - No data to display  EKG   Radiology No results found.  Procedures Procedures (including critical care time)  Medications Ordered in UC Medications - No data to display  Initial Impression / Assessment and Plan / UC Course  I have reviewed the triage vital signs and the nursing notes.  Pertinent labs & imaging results that were available during my care of the patient were reviewed by me and considered in my medical decision making (see chart for details).      Patient given a prescription for clindamycin and hydrocodone.  He is advised to follow-up with his dentist as scheduled return to the urgent care for any problems Final Clinical Impressions(s) / UC Diagnoses   Final diagnoses:  Dental abscess   Discharge Instructions      See your dentist as scheduled   ED Prescriptions     Medication Sig Dispense Auth. Provider   clindamycin (CLEOCIN) 300 MG capsule Take 1 capsule (300 mg total) by mouth 3 (three) times daily for 10 days. 30 capsule Choua Chalker K, PA-C   HYDROcodone-acetaminophen (NORCO/VICODIN) 5-325 MG tablet Take 1 tablet by mouth every 4 (four) hours as needed. 10 tablet Viviann Broyles K, PA-C      I have reviewed the PDMP during this encounter. An After Visit Summary was printed and given to the patient.       Sandi Crosby, PA-C 07/26/23 1821

## 2023-10-20 ENCOUNTER — Encounter (HOSPITAL_COMMUNITY): Payer: Self-pay

## 2023-10-20 ENCOUNTER — Ambulatory Visit (HOSPITAL_COMMUNITY)
Admission: EM | Admit: 2023-10-20 | Discharge: 2023-10-20 | Disposition: A | Discharge status: Home / Self Care | Attending: Family Medicine | Admitting: Family Medicine

## 2023-10-20 DIAGNOSIS — R030 Elevated blood-pressure reading, without diagnosis of hypertension: Secondary | ICD-10-CM | POA: Diagnosis not present

## 2023-10-20 DIAGNOSIS — R21 Rash and other nonspecific skin eruption: Secondary | ICD-10-CM

## 2023-10-20 DIAGNOSIS — B356 Tinea cruris: Secondary | ICD-10-CM | POA: Diagnosis not present

## 2023-10-20 MED ORDER — CLOTRIMAZOLE 1 % EX CREA: TOPICAL_CREAM | CUTANEOUS | 0 refills | Status: AC

## 2023-10-20 MED ORDER — TRIAMCINOLONE ACETONIDE 0.1 % EX CREA: 1.0000 | TOPICAL_CREAM | Freq: Two times a day (BID) | CUTANEOUS | 0 refills | Status: AC

## 2023-10-20 NOTE — ED Provider Notes (Signed)
  [facility]   [id number] 10/20/23 Arrival Time: 0913  ASSESSMENT & PLAN:  1. Tinea cruris   2. Rash and nonspecific skin eruption   3. Elevated blood pressure reading without diagnosis of hypertension    Unclear etiology of rash. May be secondary to irritation from topical skin product he applied recently. Ques early tinea. Trial of: Meds ordered this encounter  Medications   triamcinolone  cream (KENALOG ) 0.1 %    Sig: Apply 1 Application topically 2 (two) times daily.    Dispense:  30 g    Refill:  0   clotrimazole  (LOTRIMIN ) 1 % cream    Sig: Apply to affected area 2 times daily    Dispense:  42 g    Refill:  0   No signs of bacterial skin infection.   Will follow up with PCP or here if worsening or failing to improve as anticipated. Reviewed expectations re: course of current medical issues. Questions answered. Outlined signs and symptoms indicating need for more acute intervention. Patient verbalized understanding. After Visit Summary given.   SUBJECTIVE:  Nathaniel Miller is a 53 y.o. male who presents with a skin complaint.Pt c/o rash to both sides of groin since Friday. States feels like a razor burn. States using blue star with relief. States he does shave. Noted rash after applying dark spot remover spray to area.    OBJECTIVE: Vitals:   10/20/23 0956  BP: (!) 166/108  Pulse: 60  Resp: 18  Temp: 98.4 F (36.9 C)  TempSrc: Oral  SpO2: 100%    General appearance: alert; no distress Extremities: no edema; moves all extremities normally Skin: warm and dry; mild irritation and redness to groin area Psychological: alert and cooperative; normal mood and affect  No Known Allergies  History reviewed. No pertinent past medical history. Social History   Socioeconomic History   Marital status: Married    Spouse name: Not on file   Number of children: Not on file   Years of education: Not on file   Highest education level: Not on file   Occupational History   Not on file  Tobacco Use   Smoking status: Never   Smokeless tobacco: Never  Vaping Use   Vaping status: Never Used  Substance and Sexual Activity   Alcohol use: No   Drug use: No   Sexual activity: Yes    Birth control/protection: None  Other Topics Concern   Not on file  Social History Narrative   Not on file   Social Drivers of Health   Financial Resource Strain: Not on file  Food Insecurity: Not on file  Transportation Needs: Not on file  Physical Activity: Not on file  Stress: Not on file  Social Connections: Not on file  Intimate Partner Violence: Not on file   Family History  Problem Relation Age of Onset   Cancer Mother    Hypertension Father    Cancer Sister    History reviewed. No pertinent surgical history.    Rolinda Rogue, MD 10/20/23 1310

## 2023-10-20 NOTE — ED Triage Notes (Signed)
 Pt c/o rash to both sides of groin since Friday. States feels like a razor burn. States using blue star with relief. States he does shave.

## 2023-10-20 NOTE — Discharge Instructions (Signed)
 Your blood pressure was noted to be elevated during your visit today. If you are currently taking medication for high blood pressure, please ensure you are taking this as directed. If you do not have a history of high blood pressure and your blood pressure remains persistently elevated, you may need to begin taking a medication at some point. You may return here within the next few days to recheck if unable to see your primary care provider or if you do not have a one.  BP (!) 166/108 (BP Location: Right Arm)   Pulse 60   Temp 98.4 F (36.9 C) (Oral)   Resp 18   SpO2 100%   BP Readings from Last 3 Encounters:  10/20/23 (!) 166/108  07/26/23 (!) 175/98  10/13/19 (!) 161/104
